# Patient Record
Sex: Female | Born: 1975 | Race: White | Hispanic: No | Marital: Married | Smoking: Former smoker
Health system: Southern US, Community
[De-identification: ages and names within clinical notes are randomized; demographics above are authoritative.]

## PROBLEM LIST (undated history)

## (undated) DIAGNOSIS — E7800 Pure hypercholesterolemia, unspecified: Secondary | ICD-10-CM

## (undated) DIAGNOSIS — F419 Anxiety disorder, unspecified: Secondary | ICD-10-CM

## (undated) DIAGNOSIS — N200 Calculus of kidney: Secondary | ICD-10-CM

## (undated) DIAGNOSIS — O24419 Gestational diabetes mellitus in pregnancy, unspecified control: Secondary | ICD-10-CM

## (undated) DIAGNOSIS — IMO0001 Reserved for inherently not codable concepts without codable children: Secondary | ICD-10-CM

## (undated) DIAGNOSIS — Z9851 Tubal ligation status: Secondary | ICD-10-CM

## (undated) HISTORY — DX: Anxiety disorder, unspecified: F41.9

## (undated) HISTORY — DX: Gestational diabetes mellitus in pregnancy, unspecified control: O24.419

## (undated) HISTORY — PX: NO PAST SURGERIES: SHX2092

---

## 2010-01-18 ENCOUNTER — Emergency Department (HOSPITAL_COMMUNITY): Admission: EM | Admit: 2010-01-18 | Discharge: 2010-01-18 | Payer: Self-pay | Admitting: Emergency Medicine

## 2010-07-13 ENCOUNTER — Emergency Department (HOSPITAL_COMMUNITY): Admission: EM | Admit: 2010-07-13 | Discharge: 2010-07-14 | Payer: Self-pay | Admitting: Emergency Medicine

## 2010-07-13 ENCOUNTER — Emergency Department (HOSPITAL_COMMUNITY): Admission: EM | Admit: 2010-07-13 | Discharge: 2010-07-13 | Payer: Self-pay | Admitting: Family Medicine

## 2011-02-11 LAB — POCT URINALYSIS DIPSTICK
Ketones, ur: NEGATIVE mg/dL
Nitrite: NEGATIVE
Specific Gravity, Urine: 1.025 (ref 1.005–1.030)
Urobilinogen, UA: 0.2 mg/dL (ref 0.0–1.0)

## 2011-02-11 LAB — DIFFERENTIAL
Basophils Absolute: 0.1 10*3/uL (ref 0.0–0.1)
Basophils Relative: 1 % (ref 0–1)
Eosinophils Absolute: 0.3 10*3/uL (ref 0.0–0.7)
Eosinophils Relative: 3 % (ref 0–5)
Lymphocytes Relative: 34 % (ref 12–46)
Lymphs Abs: 2.8 10*3/uL (ref 0.7–4.0)
Neutro Abs: 4.3 10*3/uL (ref 1.7–7.7)

## 2011-02-11 LAB — BASIC METABOLIC PANEL
CO2: 28 mEq/L (ref 19–32)
Calcium: 9.4 mg/dL (ref 8.4–10.5)
Chloride: 104 mEq/L (ref 96–112)
GFR calc non Af Amer: >60 mL/min (ref >60)

## 2011-02-11 LAB — HEPATIC FUNCTION PANEL
Bilirubin, Direct: <0.1 mg/dL (ref 0.0–0.3)
Total Protein: 7.6 g/dL (ref 6.0–8.3)

## 2011-02-11 LAB — WET PREP, GENITAL
Trich, Wet Prep: NONE SEEN
Yeast Wet Prep HPF POC: NONE SEEN

## 2011-02-11 LAB — URINALYSIS, ROUTINE W REFLEX MICROSCOPIC: Ketones, ur: NEGATIVE mg/dL

## 2011-02-11 LAB — GC/CHLAMYDIA PROBE AMP, GENITAL
Chlamydia, DNA Probe: NEGATIVE
GC Probe Amp, Genital: NEGATIVE

## 2011-02-11 LAB — PREGNANCY, URINE: Preg Test, Ur: NEGATIVE

## 2011-02-11 LAB — CBC
MCH: 29.6 pg (ref 26.0–34.0)
MCHC: 33.4 g/dL (ref 30.0–36.0)
MCV: 88.7 fL (ref 78.0–100.0)
Platelets: 241 10*3/uL (ref 150–400)
RBC: 4.52 MIL/uL (ref 3.87–5.11)
RDW: 12.5 % (ref 11.5–15.5)
WBC: 8.1 10*3/uL (ref 4.0–10.5)

## 2011-02-11 LAB — POCT PREGNANCY, URINE: Preg Test, Ur: NEGATIVE

## 2013-01-30 ENCOUNTER — Ambulatory Visit (INDEPENDENT_AMBULATORY_CARE_PROVIDER_SITE_OTHER): Payer: 59 | Admitting: Physician Assistant

## 2013-01-30 ENCOUNTER — Encounter: Payer: Self-pay | Admitting: Physician Assistant

## 2013-01-30 VITALS — BP 128/79 | HR 74 | Ht 66.25 in | Wt 158.0 lb

## 2013-01-30 DIAGNOSIS — Z13220 Encounter for screening for lipoid disorders: Secondary | ICD-10-CM

## 2013-01-30 DIAGNOSIS — I4902 Ventricular flutter: Secondary | ICD-10-CM

## 2013-01-30 DIAGNOSIS — I498 Other specified cardiac arrhythmias: Secondary | ICD-10-CM

## 2013-01-30 DIAGNOSIS — F411 Generalized anxiety disorder: Secondary | ICD-10-CM

## 2013-01-30 DIAGNOSIS — F410 Panic disorder [episodic paroxysmal anxiety] without agoraphobia: Secondary | ICD-10-CM | POA: Insufficient documentation

## 2013-01-30 DIAGNOSIS — Z131 Encounter for screening for diabetes mellitus: Secondary | ICD-10-CM

## 2013-01-30 MED ORDER — SERTRALINE HCL 100 MG PO TABS: ORAL_TABLET | ORAL | Status: DC

## 2013-01-30 MED ORDER — ALPRAZOLAM 0.5 MG PO TABS: 0.5000 mg | ORAL_TABLET | ORAL | Status: DC | PRN

## 2013-01-30 NOTE — Patient Instructions (Addendum)
   Premature Ventricular Contraction Premature ventricular contraction (PVC) is an irregularity of the heart rhythm involving extra or skipped heartbeats. In some cases, they may occur without obvious cause or heart disease. Other times, they can be caused by an electrolyte change in the blood. These need to be corrected. They can also be seen when there is not enough oxygen going to the heart. A common cause of this is plaque or cholesterol buildup. This buildup decreases the blood supply to the heart. In addition, extra beats may be caused or aggravated by:  Excessive smoking.  Alcohol consumption.  Caffeine.  Certain medications  Some street drugs. SYMPTOMS   The sensation of feeling your heart skipping a beat (palpitations).  In many cases, the person may have no symptoms. SIGNS AND TESTS   A physical examination may show an occasional irregularity, but if the PVC beats do not happen often, they may not be found on physical exam.  Blood pressure is usually normal.  Other tests that may find extra beats of the heart are:  An EKG (electrocardiogram)  A Holter monitor which can monitor your heart over longer periods of time  An Angiogram (study of the heart arteries). TREATMENT  Usually extra heartbeats do not need treatment. The condition is treated only if symptoms are severe or if extra beats are very frequent or are causing problems. An underlying cause, if discovered, may also require treatment.  Treatment may also be needed if there may be a risk for other more serious cardiac arrhythmias.  PREVENTION   Moderation in caffeine, alcohol, and tobacco use may reduce the risk of ectopic heartbeats in some people.  Exercise often helps people who lead a sedentary (inactive) lifestyle. PROGNOSIS  PVC heartbeats are generally harmless and do not need treatment.  RISKS AND COMPLICATIONS   Ventricular tachycardia (occasionally).  There usually are no complications.  Other  arrhythmias (occasionally). SEEK IMMEDIATE MEDICAL CARE IF:   You feel palpitations that are frequent or continual.  You develop chest pain or other problems such as shortness of breath, sweating, or nausea and vomiting.  You become light-headed or faint (pass out).  You get worse or do not improve with treatment. Document Released: 07/01/2004 Document Revised: 02/06/2012 Document Reviewed: 01/11/2008 [facility] Patient Information 2013 [place], [place].

## 2013-01-30 NOTE — Progress Notes (Signed)
  Subjective:    Patient ID: Makayla Lopez, female    DOB: 1976/04/17, 37 y.o.   MRN: [id number]  HPI Patient is a 37 yo female who presents to the clinic to establish care. PMH of anxiety and panic attacks. This has just started in November that she has needed medication. She was put on Zoloft and did help. In January zoloft was increased to 100mg  and also helped some. She had been having panic attacks every day but now only 2-3 times a week. She does have xanax and dose work but does not like taking it. She admits to having a lot going on. Her family moved here from [place] for her husbands job and then he got laid off. She feels like she is carrying the burden. Husband recently got a job and things feel like they are getting better. When she has the attacks her heart flutters and feels like it is going so fast. She denies any CP or SOB. She does have weird left arm pain off and on and remembers no injury. She tries to exercise in the form of running when she has a chance. She never experiences panic attack while running.  Denies any excessive caffiene. Does not drink alcohol.   Family, Social, surgical hx reviewed.   Pap up to date.  .    Review of Systems  Constitutional: Negative.   HENT: Negative.   Eyes: Negative.   Respiratory: Negative.   Cardiovascular: Negative.   Gastrointestinal: Negative.   Endocrine: Negative.   Genitourinary: Negative.   Musculoskeletal: Negative.   Skin: Negative.   Neurological: Negative.   Hematological: Negative.        Objective:   Physical Exam  Constitutional: She appears well-developed and well-nourished.  HENT:  Head: Normocephalic and atraumatic.  Eyes: Conjunctivae are normal. Right eye exhibits no discharge. Left eye exhibits no discharge.  Neck: Normal range of motion. Neck supple. No thyromegaly present.  Cardiovascular: Normal rate, regular rhythm and normal heart sounds.   Pulmonary/Chest: Effort normal and breath sounds normal. She has  no wheezes.  Lymphadenopathy:    She has no cervical adenopathy.  Skin: Skin is warm and dry.  Psychiatric: She has a normal mood and affect. Her behavior is normal.          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 EKG no acute ST changes. NSR. Reassured patient that sounded like anxiety and panic attacks. Encouraged her to take xanax at onset. Discussed adding buspar but patient did not want to start at this point. Will increase zoloft to 150mg  daily.Discussed there could be some PVC;s going on. Gave handout to look over and see if any changes in diet need to be made. If continues or worsens can consider stress testing. Will check TSH.  Follow up in 6 to 8 weeks.   Needs CPE at some point. Gave lab slip to get fasting labs drawn.

## 2013-01-31 LAB — COMPREHENSIVE METABOLIC PANEL
CO2: 26 mEq/L (ref 19–32)
Calcium: 9.8 mg/dL (ref 8.4–10.5)
Chloride: 104 mEq/L (ref 96–112)
Potassium: 4.2 mEq/L (ref 3.5–5.3)
Sodium: 139 mEq/L (ref 135–145)
Total Bilirubin: 0.5 mg/dL (ref 0.3–1.2)
Total Protein: 7.8 g/dL (ref 6.0–8.3)

## 2013-01-31 LAB — CBC WITH DIFFERENTIAL/PLATELET
Basophils Absolute: 0.1 10*3/uL (ref 0.0–0.1)
Basophils Relative: 1 % (ref 0–1)
MCHC: 33.7 g/dL (ref 30.0–36.0)
Monocytes Absolute: 1 10*3/uL (ref 0.1–1.0)
RDW: 13.3 % (ref 11.5–15.5)

## 2013-01-31 LAB — LIPID PANEL
LDL Cholesterol: 172 mg/dL — ABNORMAL HIGH (ref 0–99)
Triglycerides: 116 mg/dL (ref <150)
VLDL: 23 mg/dL (ref 0–40)

## 2013-01-31 LAB — TSH: TSH: 3.083 u[IU]/mL (ref 0.350–4.500)

## 2013-02-04 ENCOUNTER — Other Ambulatory Visit: Payer: Self-pay | Admitting: Physician Assistant

## 2013-02-04 MED ORDER — ERGOCALCIFEROL 1.25 MG (50000 UT) PO CAPS: 50000.0000 [IU] | ORAL_CAPSULE | ORAL | Status: DC

## 2013-02-06 ENCOUNTER — Encounter: Payer: Self-pay | Admitting: *Deleted

## 2013-02-06 ENCOUNTER — Other Ambulatory Visit: Payer: Self-pay | Admitting: Physician Assistant

## 2013-02-06 MED ORDER — PRAVASTATIN SODIUM 40 MG PO TABS: 40.0000 mg | ORAL_TABLET | Freq: Every day | ORAL | Status: DC

## 2013-02-06 NOTE — Progress Notes (Signed)
Sent to pharmacy. Take daily. Call with any muscle aches or cramps. Remember a low fat diet and exercise. Recheck in 3-4 months.

## 2013-02-07 NOTE — Progress Notes (Signed)
Pt.notified

## 2013-03-27 ENCOUNTER — Ambulatory Visit: Payer: 59 | Admitting: Physician Assistant

## 2013-03-27 DIAGNOSIS — Z0289 Encounter for other administrative examinations: Secondary | ICD-10-CM

## 2013-05-06 ENCOUNTER — Emergency Department (HOSPITAL_COMMUNITY)
Admission: EM | Admit: 2013-05-06 | Discharge: 2013-05-07 | Disposition: A | Discharge status: Home / Self Care | Payer: 59 | Attending: Emergency Medicine | Admitting: Emergency Medicine

## 2013-05-06 ENCOUNTER — Emergency Department
Admission: EM | Admit: 2013-05-06 | Discharge: 2013-05-06 | Disposition: A | Discharge status: Home / Self Care | Payer: 59 | Source: Home / Self Care | Attending: Family Medicine | Admitting: Family Medicine

## 2013-05-06 ENCOUNTER — Encounter (HOSPITAL_COMMUNITY): Payer: Self-pay | Admitting: Emergency Medicine

## 2013-05-06 ENCOUNTER — Emergency Department (INDEPENDENT_AMBULATORY_CARE_PROVIDER_SITE_OTHER): Payer: 59

## 2013-05-06 ENCOUNTER — Encounter: Payer: Self-pay | Admitting: *Deleted

## 2013-05-06 DIAGNOSIS — E7800 Pure hypercholesterolemia, unspecified: Secondary | ICD-10-CM | POA: Insufficient documentation

## 2013-05-06 DIAGNOSIS — Z975 Presence of (intrauterine) contraceptive device: Secondary | ICD-10-CM | POA: Insufficient documentation

## 2013-05-06 DIAGNOSIS — R300 Dysuria: Secondary | ICD-10-CM

## 2013-05-06 DIAGNOSIS — Z87891 Personal history of nicotine dependence: Secondary | ICD-10-CM | POA: Insufficient documentation

## 2013-05-06 DIAGNOSIS — R350 Frequency of micturition: Secondary | ICD-10-CM

## 2013-05-06 DIAGNOSIS — Z79899 Other long term (current) drug therapy: Secondary | ICD-10-CM | POA: Insufficient documentation

## 2013-05-06 DIAGNOSIS — Z3202 Encounter for pregnancy test, result negative: Secondary | ICD-10-CM | POA: Insufficient documentation

## 2013-05-06 DIAGNOSIS — R3915 Urgency of urination: Secondary | ICD-10-CM | POA: Insufficient documentation

## 2013-05-06 DIAGNOSIS — R109 Unspecified abdominal pain: Secondary | ICD-10-CM

## 2013-05-06 DIAGNOSIS — N72 Inflammatory disease of cervix uteri: Secondary | ICD-10-CM | POA: Insufficient documentation

## 2013-05-06 DIAGNOSIS — R110 Nausea: Secondary | ICD-10-CM | POA: Insufficient documentation

## 2013-05-06 DIAGNOSIS — Z87442 Personal history of urinary calculi: Secondary | ICD-10-CM | POA: Insufficient documentation

## 2013-05-06 DIAGNOSIS — F411 Generalized anxiety disorder: Secondary | ICD-10-CM | POA: Insufficient documentation

## 2013-05-06 HISTORY — DX: Pure hypercholesterolemia, unspecified: E78.00

## 2013-05-06 HISTORY — DX: Calculus of kidney: N20.0

## 2013-05-06 LAB — POCT URINALYSIS DIP (MANUAL ENTRY)
Glucose, UA: NEGATIVE
Ketones, POC UA: NEGATIVE
Leukocytes, UA: NEGATIVE
Nitrite, UA: NEGATIVE
Protein Ur, POC: NEGATIVE
Spec Grav, UA: 1.025 (ref 1.005–1.03)
Urobilinogen, UA: 0.2 (ref 0–1)
pH, UA: 6.5 (ref 5–8)

## 2013-05-06 MED ORDER — CEFTRIAXONE SODIUM 1 G IJ SOLR
1.0000 g | Freq: Once | INTRAMUSCULAR | Status: AC
  Administered 2013-05-06: 1 g via INTRAMUSCULAR

## 2013-05-06 MED ORDER — CEPHALEXIN 500 MG PO CAPS: 500.0000 mg | ORAL_CAPSULE | Freq: Three times a day (TID) | ORAL | Status: DC

## 2013-05-06 MED ORDER — KETOROLAC TROMETHAMINE 30 MG/ML IJ SOLN
30.0000 mg | Freq: Once | INTRAMUSCULAR | Status: AC
  Administered 2013-05-06: 30 mg via INTRAMUSCULAR

## 2013-05-06 NOTE — ED Notes (Signed)
PT. REPORTS LOW BACK PAIN / RIGHT ABDOMINAL PAIN ONSET TODAY , SEEN AT AN URGENT CARE TODAY DIAGNOSED WITH UTI RECEIVED ROCEPHIN AND TORADOL IM WITH TEMPORARY RELIEF , PT. STATED HISTORY OF KIDNEY STONES.

## 2013-05-06 NOTE — ED Provider Notes (Signed)
History     CSN: [id number]  Arrival date & time 05/06/13  1858   First MD Initiated Contact with Patient 05/06/13 1900      Chief Complaint  Patient presents with  . Urinary Frequency   HPI  DYSURIA Onset:  4-6 weeks  Description: intermittent dysuria, flank pain  Modifying factors: hx/o kidney stones in the past   Symptoms Urgency:  yes Frequency: yes  Hesitancy:  yes Hematuria:  no Flank Pain:  yes Fever: no Nausea/Vomiting:  yes Missed LMP: no STD exposure: no Discharge: no Irritants: no Rash: no  Red Flags   More than 3 UTI's last 12 months:  no PMH of  Diabetes or Immunosuppression:  no Renal Disease/Calculi: yes Urinary Tract Abnormality:  no Instrumentation or Trauma: no    Past Medical History  Diagnosis Date  . Anxiety     History reviewed. No pertinent past surgical history.  Family History  Problem Relation Age of Onset  . Diabetes Mother   . Hyperlipidemia Father   . Hypertension Father   . Cancer Paternal Grandfather     History  Substance Use Topics  . Smoking status: Former Games developer  . Smokeless tobacco: Not on file  . Alcohol Use: Not on file    OB History   Grav Para Term Preterm Abortions TAB SAB Ect Mult Living                  Review of Systems  All other systems reviewed and are negative.    Allergies  Review of patient's allergies indicates no known allergies.  Home Medications   Current Outpatient Rx  Name  Route  Sig  Dispense  Refill  . ALPRAZolam (XANAX) 0.5 MG tablet   Oral   Take 1 tablet (0.5 mg total) by mouth as needed for anxiety. Up to twice a day.   30 tablet   0   . Biotin 5000 MCG CAPS   Oral   Take by mouth daily.         . ergocalciferol (VITAMIN D2) 50000 UNITS capsule   Oral   Take 1 capsule (50,000 Units total) by mouth once a week.   4 capsule   1   . Multiple Vitamin (MULTIVITAMIN) tablet   Oral   Take 1 tablet by mouth daily.         . pravastatin (PRAVACHOL) 40 MG  tablet   Oral   Take 1 tablet (40 mg total) by mouth daily.   30 tablet   3   . sertraline (ZOLOFT) 100 MG tablet      Take 1 and 1/2 tab daily.   45 tablet   2     BP 130/79  Pulse 95  Temp(Src) 98.2 F (36.8 C) (Oral)  Resp 16  Ht 5\' 6"  (1.676 m)  Wt 161 lb 12 oz (73.369 kg)  BMI 26.12 kg/m2  SpO2 100%  Physical Exam  Constitutional: She appears well-developed and well-nourished.  HENT:  Head: Normocephalic and atraumatic.  Eyes: Conjunctivae are normal. Pupils are equal, round, and reactive to light.  Neck: Normal range of motion.  Cardiovascular: Normal rate and regular rhythm.   Pulmonary/Chest: Effort normal.  Abdominal: Soft. Bowel sounds are normal.  + R sided flank pain  + suprapubic tenderness   Neurological: She is alert.  Skin: Skin is warm.    ED Course  Procedures (including critical care time)  Labs Reviewed  URINE CULTURE  POCT URINALYSIS DIP (MANUAL ENTRY)  Dg Abd 1 View  05/06/2013   *RADIOLOGY REPORT*  Clinical Data: Urinary frequency.  ABDOMEN - 1 VIEW  Comparison: None.  Findings: No abnormal calcifications are identified.  The bowel gas pattern is unremarkable.  IUD device is present in the central pelvis.  The visualized bony structures are unremarkable.  IMPRESSION: Normal abdominal film.   Original Report Authenticated By: Irish Lack, M.D.     1. Urinary frequency   2. Dysuria   3. Flank pain       MDM  KUB negative for kidney stones.  Rocephin 1gm IM x1  Will place on course of keflex.  Urine culture  Discussed infectious and GU red flags.  Go to ER if sxs worsen  Follow up as needed.     The patient and/or caregiver has been counseled thoroughly with regard to treatment plan and/or medications prescribed including dosage, schedule, interactions, rationale for use, and possible side effects and they verbalize understanding. Diagnoses and expected course of recovery discussed and will return if not improved as expected  or if the condition worsens. Patient and/or caregiver verbalized understanding.             Doree Albee, MD 05/06/13 [phone]

## 2013-05-06 NOTE — ED Provider Notes (Signed)
History     CSN: [id number]  Arrival date & time 05/06/13  2300   First MD Initiated Contact with Patient 05/06/13 2340      Chief Complaint  Patient presents with  . Back Pain    (Consider location/radiation/quality/duration/timing/severity/associated sxs/prior treatment) HPI Pt is a 37yo female with hx of kidney stones c/o acute onset right sided flank pain that is sharp and shooting down to groin, started earlier this morning, pain waxes and wanes, 7/10.  Pt was seen at urgent care earlier today, dx with UTI, was given toradol and rocephin in office, discharged home with keflex, advised to return to ED if symptoms worsened.  Pt states pain initially improved, 2/10 but came back even worse.  Pt also reports mild nausea w/o vomiting.  Pt also c/o lower abdominal pain that is cramping and dull in nature, 7/10, constant.  Denies fever or chills.  Denies pain with urination but states she feels like she cannot empty her bladder, has increased urinary frequency and urgency.  Denies previous abdominal surgeries.  Has IUD in place, was changed 1 year ago.  Denies vaginal pain or discharge.   Past Medical History  Diagnosis Date  . Anxiety   . Kidney stone   . Hypercholesteremia     History reviewed. No pertinent past surgical history.  Family History  Problem Relation Age of Onset  . Diabetes Mother   . Hyperlipidemia Father   . Hypertension Father   . Cancer Paternal Grandfather     History  Substance Use Topics  . Smoking status: Former Games developer  . Smokeless tobacco: Not on file  . Alcohol Use: No    OB History   Grav Para Term Preterm Abortions TAB SAB Ect Mult Living                  Review of Systems  Constitutional: Negative for fever, chills, diaphoresis and fatigue.  Respiratory: Negative for shortness of breath.   Cardiovascular: Negative for chest pain.  Gastrointestinal: Positive for nausea and abdominal pain. Negative for vomiting, diarrhea and constipation.   Genitourinary: Positive for urgency, frequency and flank pain. Negative for dysuria, hematuria, vaginal bleeding, vaginal discharge, vaginal pain and pelvic pain.  All other systems reviewed and are negative.    Allergies  Review of patient's allergies indicates no known allergies.  Home Medications   Current Outpatient Rx  Name  Route  Sig  Dispense  Refill  . Biotin 5000 MCG CAPS   Oral   Take by mouth daily.         Marland Kitchen ibuprofen (ADVIL,MOTRIN) 200 MG tablet   Oral   Take 800 mg by mouth every 6 (six) hours as needed for pain.         . Multiple Vitamin (MULTIVITAMIN) tablet   Oral   Take 1 tablet by mouth daily.         Marland Kitchen oxyCODONE-acetaminophen (PERCOCET/ROXICET) 5-325 MG per tablet   Oral   Take 1 tablet by mouth once.         . pravastatin (PRAVACHOL) 40 MG tablet   Oral   Take 1 tablet (40 mg total) by mouth daily.   30 tablet   3   . cephALEXin (KEFLEX) 500 MG capsule   Oral   Take 1 capsule (500 mg total) by mouth 3 (three) times daily.   21 capsule   0   . doxycycline (VIBRAMYCIN) 100 MG capsule   Oral   Take 1 capsule (100  mg total) by mouth 2 (two) times daily.   28 capsule   0   . oxyCODONE-acetaminophen (PERCOCET) 5-325 MG per tablet   Oral   Take 1 tablet by mouth every 6 (six) hours as needed for pain.   13 tablet   0     BP 109/65  Pulse 75  Temp(Src) 98.3 F (36.8 C) (Oral)  Resp 14  SpO2 97%  Physical Exam  Nursing note and vitals reviewed. Constitutional: She appears well-developed and well-nourished. No distress.  Pt lying on exam bed, appears in mild discomfort, rubbing right side of abdomen.   HENT:  Head: Normocephalic and atraumatic.  Eyes: Conjunctivae are normal. No scleral icterus.  Neck: Normal range of motion. Neck supple.  Cardiovascular: Normal rate, regular rhythm and normal heart sounds.   Pulmonary/Chest: Effort normal and breath sounds normal. No respiratory distress. She has no wheezes. She has no  rales. She exhibits no tenderness.  Abdominal: Soft. Bowel sounds are normal. She exhibits no distension and no mass. There is tenderness ( suprapubic region ). There is no rebound and no guarding. Hernia confirmed negative in the right inguinal area and confirmed negative in the left inguinal area.  Genitourinary: Uterus normal. No labial fusion. There is no rash, tenderness, lesion or injury on the right labia. There is no rash, tenderness, lesion or injury on the left labia. Cervix exhibits discharge ( scant yellow discharge). Cervix exhibits no motion tenderness and no friability. Right adnexum displays tenderness. Right adnexum displays no mass and no fullness. Left adnexum displays no mass, no tenderness and no fullness. No erythema, tenderness or bleeding around the vagina. There is a foreign body (IUD) around the vagina. No signs of injury around the vagina. No vaginal discharge found.  Musculoskeletal: Normal range of motion.  Lymphadenopathy:       Right: No inguinal adenopathy present.       Left: No inguinal adenopathy present.  Neurological: She is alert.  Skin: Skin is warm and dry. She is not diaphoretic.    ED Course  Procedures (including critical care time)  Labs Reviewed  WET PREP, GENITAL - Abnormal; Notable for the following:    Clue Cells Wet Prep HPF POC FEW (*)    WBC, Wet Prep HPF POC MANY (*)    All other components within normal limits  URINALYSIS, ROUTINE W REFLEX MICROSCOPIC - Abnormal; Notable for the following:    Hgb urine dipstick TRACE (*)    All other components within normal limits  CBC WITH DIFFERENTIAL - Abnormal; Notable for the following:    Monocytes Relative 14 (*)    Monocytes Absolute 1.2 (*)    All other components within normal limits  COMPREHENSIVE METABOLIC PANEL - Abnormal; Notable for the following:    ALT 38 (*)    Total Bilirubin 0.2 (*)    All other components within normal limits  GC/CHLAMYDIA PROBE AMP  URINE MICROSCOPIC-ADD ON   POCT PREGNANCY, URINE   Ct Abdomen Pelvis Wo Contrast  05/07/2013   *RADIOLOGY REPORT*  Clinical Data: Low back pain and right-sided abdominal pain today.  CT ABDOMEN AND PELVIS WITHOUT CONTRAST  Technique:  Multidetector CT imaging of the abdomen and pelvis was performed following the standard protocol without intravenous contrast.  Comparison: 07/14/2010  Findings: The lung bases are clear.  The kidneys appear symmetrical in size and shape.  No pyelocaliectasis or ureterectasis.  No renal, ureteral, or bladder stones are identified.  The unenhanced appearance of the liver,  spleen, gallbladder, pancreas, adrenal glands, abdominal aorta, inferior vena cava, and retroperitoneal lymph nodes is unremarkable.  Scattered mesenteric and right lower quadrant lymph nodes are not pathologically enlarged.  Stool filled colon without abnormal distension.  Stomach and small bowel are decompressed.  No free air or free fluid in the abdomen.  Abdominal wall musculature appears intact.  Pelvis:  Uterus is retroverted and intrauterine device is present. The bladder is mostly decompressed.  No free or loculated pelvic fluid collections.  No significant pelvic lymphadenopathy.  The appendix is normal.  No evidence of diverticulitis.  Normal alignment of the lumbar vertebrae.  IMPRESSION: No renal or ureteral stone or obstruction identified.   Original Report Authenticated By: Burman Nieves, M.D.   Dg Abd 1 View  05/06/2013   *RADIOLOGY REPORT*  Clinical Data: Urinary frequency.  ABDOMEN - 1 VIEW  Comparison: None.  Findings: No abnormal calcifications are identified.  The bowel gas pattern is unremarkable.  IUD device is present in the central pelvis.  The visualized bony structures are unremarkable.  IMPRESSION: Normal abdominal film.   Original Report Authenticated By: Irish Lack, M.D.   US Transvaginal Non-ob  05/07/2013   *RADIOLOGY REPORT*  Clinical Data: Lower pelvic pain.  Low back pain.  IUD in place for 2  years.  TRANSABDOMINAL AND TRANSVAGINAL ULTRASOUND OF PELVIS  DOPPLER ULTRASOUND OF OVARIES  Technique:  Both transabdominal and transvaginal ultrasound examinations of the pelvis were performed.  Transabdominal technique was performed for global imaging of the pelvis including uterus, ovaries, adnexal regions, and pelvic cul-de-sac. Color and duplex Doppler ultrasound was utilized to evaluate blood flow to the ovaries.  It was necessary to proceed with endovaginal exam following the transabdominal exam to visualize the the uterus and ovaries.  Comparison:  CT abdomen and pelvis 05/07/2013.  Findings: Uterus:  The uterus is retroverted and measures 10 x 6.5 x 7.8 cm. No myometrial mass lesions.  Endometrium: Foreign body demonstrated in the endometrium consistent with intrauterine device.  Location appears appropriate. No endometrial stripe thickness is identified.  Right ovary: The right ovary measures 2.5 x 1.8 x 1.9 cm.  Normal follicular changes are demonstrated.  Flow is demonstrated in the right ovary on color flow Doppler imaging.  Left ovary: The left ovary measures 4.8 x 3 x 2.2 cm.  Small complex cyst with internal debris measuring 1.6 x 1.8 x 1.9 cm likely representing a hemorrhagic cyst.  Flow is demonstrated in the left ovary on color flow Doppler imaging.  Pulsed Doppler evaluation demonstrates normal arterial and venous flow velocity waveforms in both ovaries.  Other Findings:  Small amount of free fluid demonstrated in the pelvis and around the left adnexal region.  This is likely to be physiologic.  IMPRESSION: Normal study.  No evidence of pelvic mass or other significant abnormality.  No sonographic evidence for ovarian torsion.   Original Report Authenticated By: Burman Nieves, M.D.   US Pelvis Complete  05/07/2013   *RADIOLOGY REPORT*  Clinical Data: Lower pelvic pain.  Low back pain.  IUD in place for 2 years.  TRANSABDOMINAL AND TRANSVAGINAL ULTRASOUND OF PELVIS  DOPPLER ULTRASOUND OF  OVARIES  Technique:  Both transabdominal and transvaginal ultrasound examinations of the pelvis were performed.  Transabdominal technique was performed for global imaging of the pelvis including uterus, ovaries, adnexal regions, and pelvic cul-de-sac. Color and duplex Doppler ultrasound was utilized to evaluate blood flow to the ovaries.  It was necessary to proceed with endovaginal exam following the transabdominal exam  to visualize the the uterus and ovaries.  Comparison:  CT abdomen and pelvis 05/07/2013.  Findings: Uterus:  The uterus is retroverted and measures 10 x 6.5 x 7.8 cm. No myometrial mass lesions.  Endometrium: Foreign body demonstrated in the endometrium consistent with intrauterine device.  Location appears appropriate. No endometrial stripe thickness is identified.  Right ovary: The right ovary measures 2.5 x 1.8 x 1.9 cm.  Normal follicular changes are demonstrated.  Flow is demonstrated in the right ovary on color flow Doppler imaging.  Left ovary: The left ovary measures 4.8 x 3 x 2.2 cm.  Small complex cyst with internal debris measuring 1.6 x 1.8 x 1.9 cm likely representing a hemorrhagic cyst.  Flow is demonstrated in the left ovary on color flow Doppler imaging.  Pulsed Doppler evaluation demonstrates normal arterial and venous flow velocity waveforms in both ovaries.  Other Findings:  Small amount of free fluid demonstrated in the pelvis and around the left adnexal region.  This is likely to be physiologic.  IMPRESSION: Normal study.  No evidence of pelvic mass or other significant abnormality.  No sonographic evidence for ovarian torsion.   Original Report Authenticated By: Burman Nieves, M.D.   [place] Art/ven Flow Abd Pelv Doppler  05/07/2013   *RADIOLOGY REPORT*  Clinical Data: Lower pelvic pain.  Low back pain.  IUD in place for 2 years.  TRANSABDOMINAL AND TRANSVAGINAL ULTRASOUND OF PELVIS  DOPPLER ULTRASOUND OF OVARIES  Technique:  Both transabdominal and transvaginal ultrasound  examinations of the pelvis were performed.  Transabdominal technique was performed for global imaging of the pelvis including uterus, ovaries, adnexal regions, and pelvic cul-de-sac. Color and duplex Doppler ultrasound was utilized to evaluate blood flow to the ovaries.  It was necessary to proceed with endovaginal exam following the transabdominal exam to visualize the the uterus and ovaries.  Comparison:  CT abdomen and pelvis 05/07/2013.  Findings: Uterus:  The uterus is retroverted and measures 10 x 6.5 x 7.8 cm. No myometrial mass lesions.  Endometrium: Foreign body demonstrated in the endometrium consistent with intrauterine device.  Location appears appropriate. No endometrial stripe thickness is identified.  Right ovary: The right ovary measures 2.5 x 1.8 x 1.9 cm.  Normal follicular changes are demonstrated.  Flow is demonstrated in the right ovary on color flow Doppler imaging.  Left ovary: The left ovary measures 4.8 x 3 x 2.2 cm.  Small complex cyst with internal debris measuring 1.6 x 1.8 x 1.9 cm likely representing a hemorrhagic cyst.  Flow is demonstrated in the left ovary on color flow Doppler imaging.  Pulsed Doppler evaluation demonstrates normal arterial and venous flow velocity waveforms in both ovaries.  Other Findings:  Small amount of free fluid demonstrated in the pelvis and around the left adnexal region.  This is likely to be physiologic.  IMPRESSION: Normal study.  No evidence of pelvic mass or other significant abnormality.  No sonographic evidence for ovarian torsion.   Original Report Authenticated By: Burman Nieves, M.D.     1. Cervicitis and endocervicitis       MDM  Pt dx with UTI earlier today at urgent care presented with increased right flank pain associated with mild nausea.  States she was given toradol and rocephin at urgent care, discharged home and advised to come to ED for worsening symptoms.  Concern for nephrolithiasis, or pelvic cause for pain such as mass or  infection.  Pt does have IUD in place which could cause PID.  Getting repeat  CBC, CMP, UA, urine preg, and a CT abd to look for stones.  11:50 AM Tx with toradol and zofran at this time.   Wet prep: many bacteria, evidence of pelvic infection will start tx in ED of azithromycin and rocephin.  Pt will need to be d/c home with doxycycline and f/u in the morning with GYN to have IUD removed.    Pelvic U/S pending at shift changed.  Signed out to Dr. Gayla Medicus, PA-C 05/07/13 1150  Junius Finner, PA-C 05/07/13 1152

## 2013-05-06 NOTE — ED Notes (Signed)
Patient c/o urinary frequency, back pain x 2 days.

## 2013-05-07 ENCOUNTER — Emergency Department (HOSPITAL_COMMUNITY): Payer: 59

## 2013-05-07 ENCOUNTER — Encounter (HOSPITAL_COMMUNITY): Payer: Self-pay | Admitting: Radiology

## 2013-05-07 LAB — COMPREHENSIVE METABOLIC PANEL
ALT: 38 U/L — ABNORMAL HIGH (ref 0–35)
Albumin: 4 g/dL (ref 3.5–5.2)
Alkaline Phosphatase: 62 U/L (ref 39–117)
Chloride: 104 mEq/L (ref 96–112)
Glucose, Bld: 92 mg/dL (ref 70–99)
Potassium: 3.8 mEq/L (ref 3.5–5.1)
Sodium: 137 mEq/L (ref 135–145)
Total Bilirubin: 0.2 mg/dL — ABNORMAL LOW (ref 0.3–1.2)
Total Protein: 7.5 g/dL (ref 6.0–8.3)

## 2013-05-07 LAB — URINALYSIS, ROUTINE W REFLEX MICROSCOPIC
Bilirubin Urine: NEGATIVE
Leukocytes, UA: NEGATIVE
Nitrite: NEGATIVE
Specific Gravity, Urine: 1.028 (ref 1.005–1.030)
Urobilinogen, UA: 0.2 mg/dL (ref 0.0–1.0)
pH: 5.5 (ref 5.0–8.0)

## 2013-05-07 LAB — CBC WITH DIFFERENTIAL/PLATELET
Basophils Relative: 1 % (ref 0–1)
Eosinophils Absolute: 0.4 10*3/uL (ref 0.0–0.7)
Hemoglobin: 12.9 g/dL (ref 12.0–15.0)
Lymphs Abs: 2.7 10*3/uL (ref 0.7–4.0)
MCH: 29 pg (ref 26.0–34.0)
Neutro Abs: 4.3 10*3/uL (ref 1.7–7.7)
Neutrophils Relative %: 50 % (ref 43–77)
Platelets: 266 10*3/uL (ref 150–400)
RBC: 4.45 MIL/uL (ref 3.87–5.11)

## 2013-05-07 LAB — WET PREP, GENITAL
Trich, Wet Prep: NONE SEEN
Yeast Wet Prep HPF POC: NONE SEEN

## 2013-05-07 LAB — URINE MICROSCOPIC-ADD ON

## 2013-05-07 MED ORDER — MORPHINE SULFATE 4 MG/ML IJ SOLN
4.0000 mg | Freq: Once | INTRAMUSCULAR | Status: AC
  Administered 2013-05-07: 4 mg via INTRAVENOUS
  Filled 2013-05-07: qty 1

## 2013-05-07 MED ORDER — CEFTRIAXONE SODIUM 250 MG IJ SOLR
250.0000 mg | Freq: Once | INTRAMUSCULAR | Status: AC
  Administered 2013-05-07: 250 mg via INTRAMUSCULAR
  Filled 2013-05-07: qty 250

## 2013-05-07 MED ORDER — KETOROLAC TROMETHAMINE 30 MG/ML IJ SOLN
30.0000 mg | Freq: Once | INTRAMUSCULAR | Status: AC
  Administered 2013-05-07: 30 mg via INTRAVENOUS
  Filled 2013-05-07: qty 1

## 2013-05-07 MED ORDER — LIDOCAINE HCL (PF) 1 % IJ SOLN
INTRAMUSCULAR | Status: AC
  Administered 2013-05-07: 5 mL
  Filled 2013-05-07: qty 5

## 2013-05-07 MED ORDER — KETOROLAC TROMETHAMINE 60 MG/2ML IM SOLN: 60.0000 mg | Freq: Once | INTRAMUSCULAR | Status: DC

## 2013-05-07 MED ORDER — OXYCODONE-ACETAMINOPHEN 5-325 MG PO TABS: 1.0000 | ORAL_TABLET | Freq: Four times a day (QID) | ORAL | Status: DC | PRN

## 2013-05-07 MED ORDER — ONDANSETRON 4 MG PO TBDP
4.0000 mg | ORAL_TABLET | Freq: Once | ORAL | Status: AC
  Administered 2013-05-07: 4 mg via ORAL
  Filled 2013-05-07: qty 1

## 2013-05-07 MED ORDER — DOXYCYCLINE HYCLATE 100 MG PO CAPS: 100.0000 mg | ORAL_CAPSULE | Freq: Two times a day (BID) | ORAL | Status: DC

## 2013-05-07 MED ORDER — AZITHROMYCIN 250 MG PO TABS
1000.0000 mg | ORAL_TABLET | Freq: Once | ORAL | Status: AC
  Administered 2013-05-07: 1000 mg via ORAL
  Filled 2013-05-07: qty 4

## 2013-05-07 NOTE — ED Provider Notes (Signed)
Medical screening examination/treatment/procedure(s) were performed by non-physician practitioner and as supervising physician I was immediately available for consultation/collaboration.  Jasmine Awe, MD 05/07/13 [phone]

## 2013-05-08 LAB — GC/CHLAMYDIA PROBE AMP
CT Probe RNA: NEGATIVE
GC Probe RNA: NEGATIVE

## 2013-05-09 LAB — URINE CULTURE

## 2013-10-03 ENCOUNTER — Other Ambulatory Visit: Payer: Self-pay

## 2013-11-09 IMAGING — CR DG ABDOMEN 1V
2 series · 2 of 2 positions shown · non-contrast
Comparison: None.

CLINICAL DATA: Urinary frequency.

ABDOMEN - 1 VIEW

[view not recorded (1 of 2)]
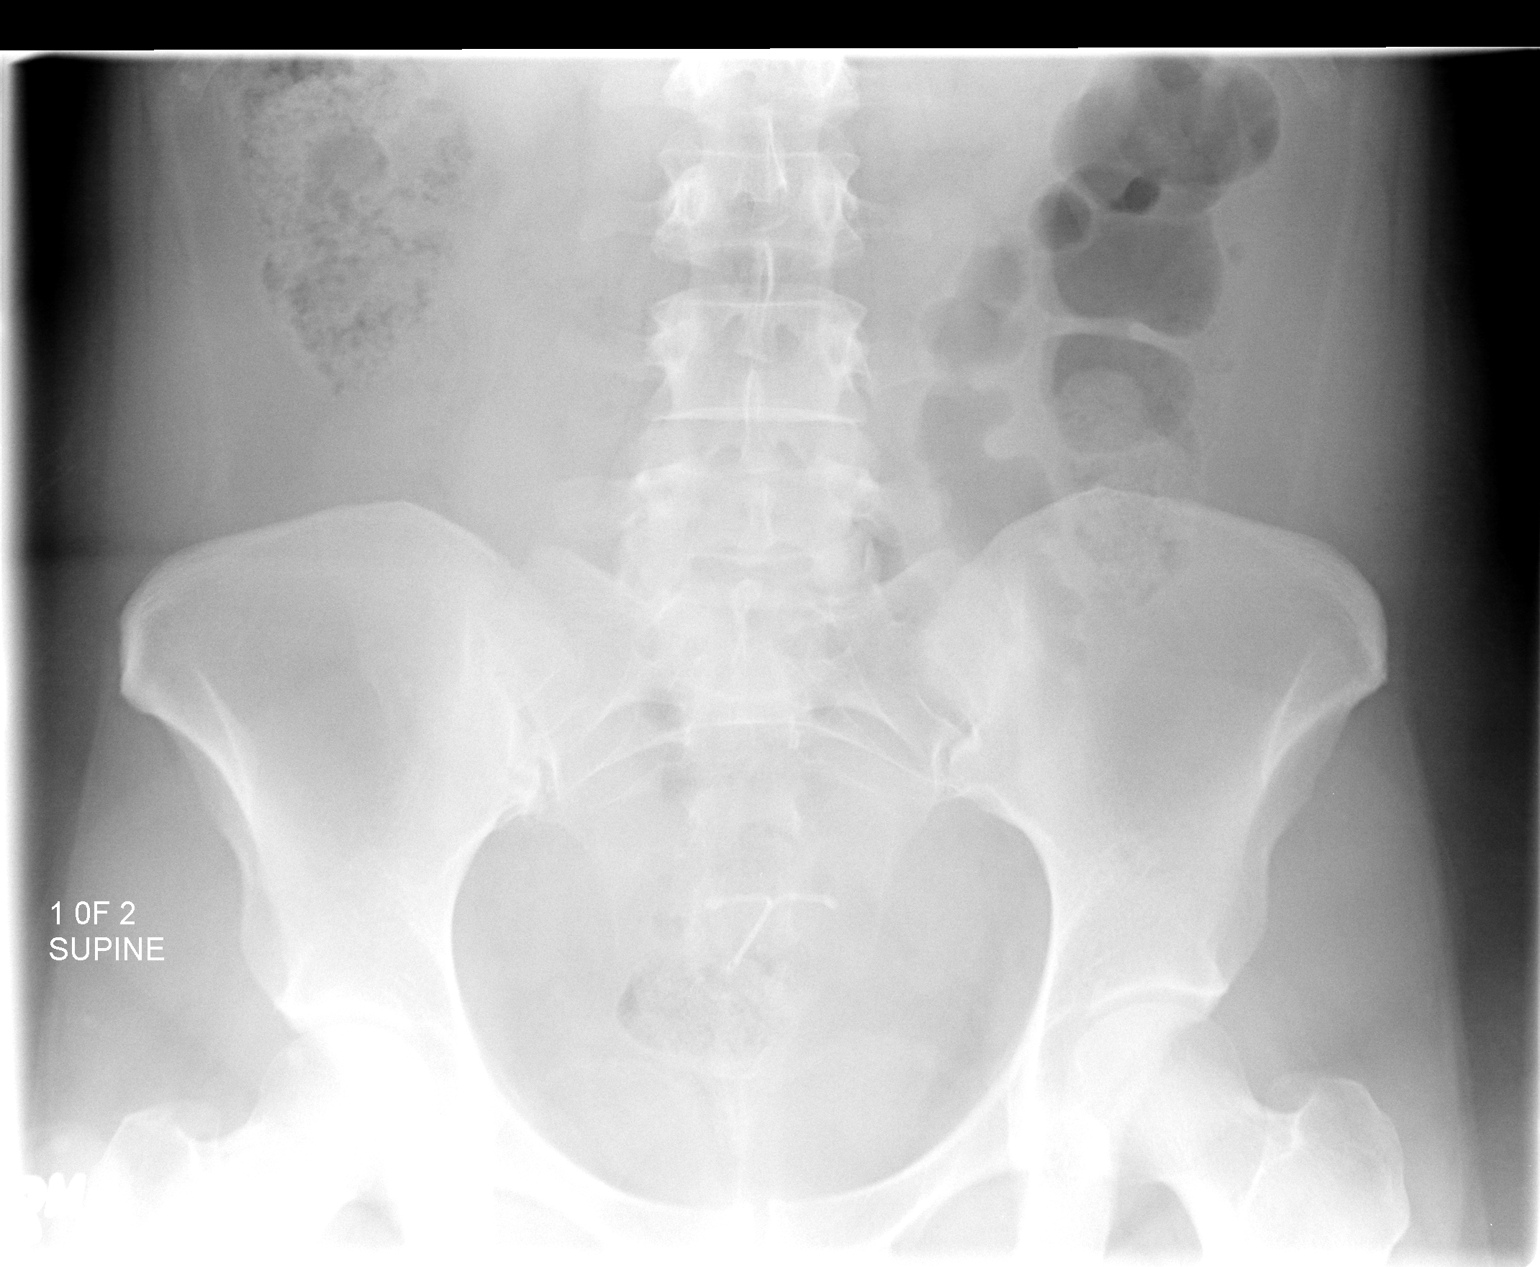

[view not recorded (2 of 2)]
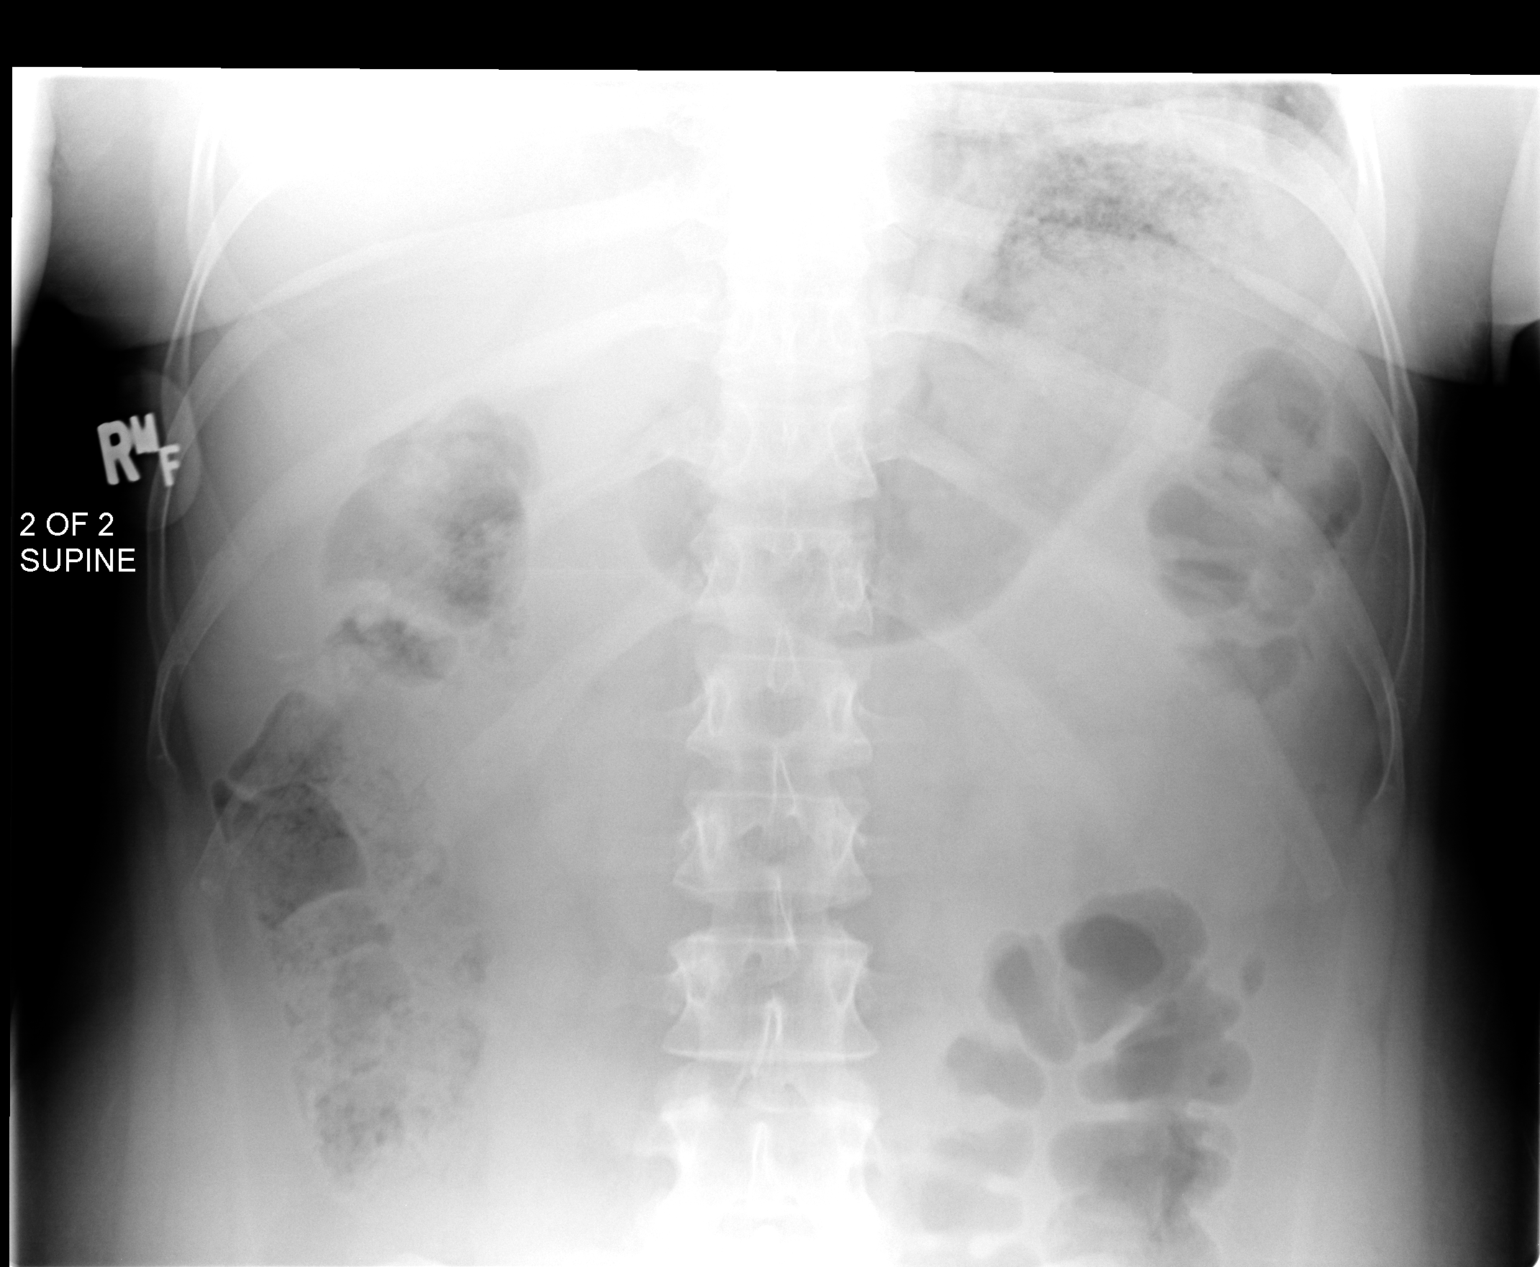

[2 of 2 positions shown; findings below may reference images not displayed]

FINDINGS: No abnormal calcifications are identified.  The bowel gas
pattern is unremarkable.  IUD device is present in the central
pelvis.  The visualized bony structures are unremarkable.
IMPRESSION: Normal abdominal film.

## 2013-11-10 IMAGING — CT CT ABD-PELV W/O CM
2 of 4 series · 13 of 32 positions shown, 19 images · non-contrast
Comparison: 07/14/2010

CLINICAL DATA: Low back pain and right-sided abdominal pain today.

CT ABDOMEN AND PELVIS WITHOUT CONTRAST
TECHNIQUE: Multidetector CT imaging of the abdomen and pelvis was
performed following the standard protocol without intravenous
contrast.

[Series 2: renal stone · axial · 0.70mm/px · z∈[-395,-45]mm · 9 of 88 slices shown, 15 images]
[im 9/88  soft-tissue]
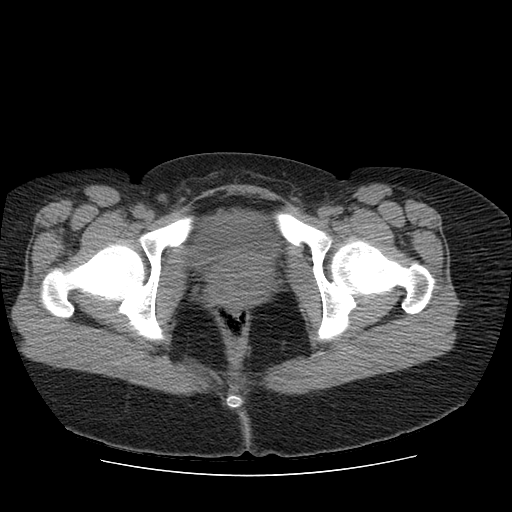
[im 9/88  bone]
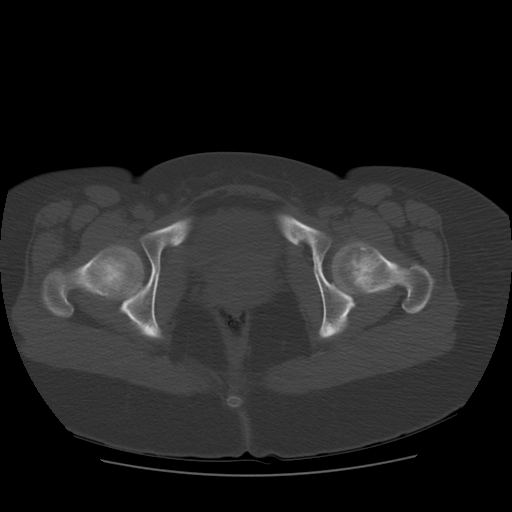
[im 18/88  soft-tissue]
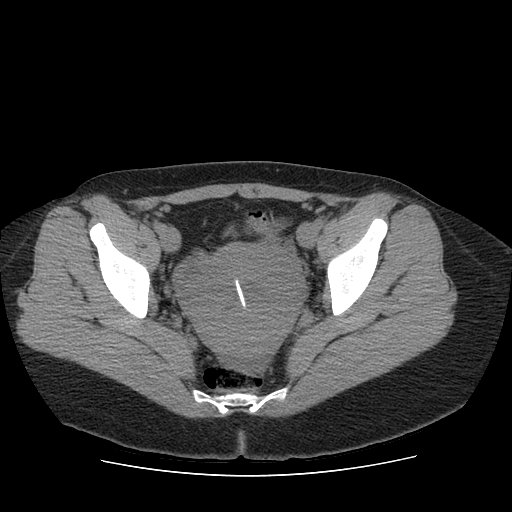
[im 27/88  soft-tissue]
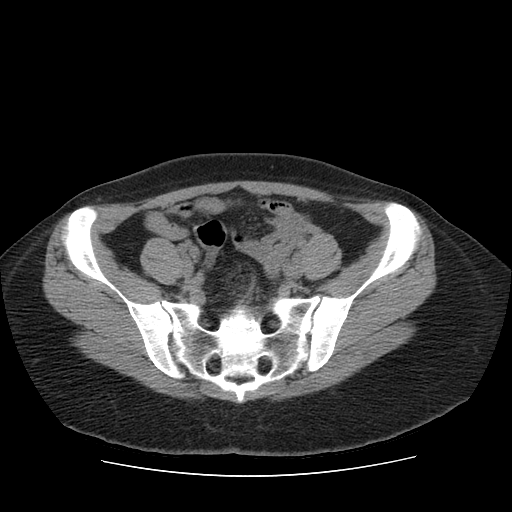
[im 35/88  soft-tissue]
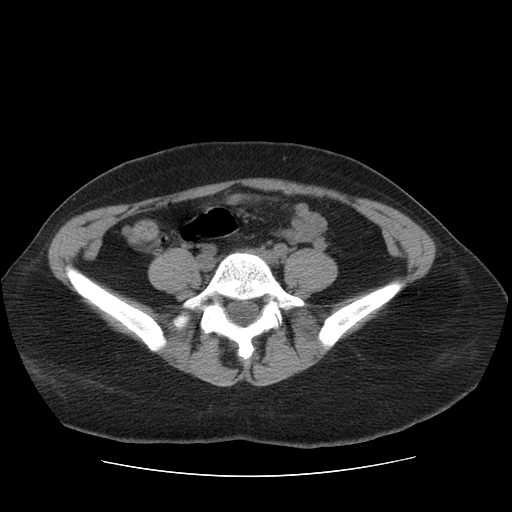
[im 44/88  soft-tissue]
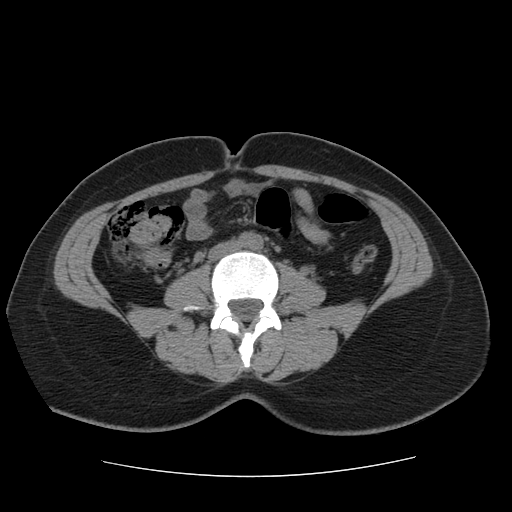
[im 53/88  soft-tissue]
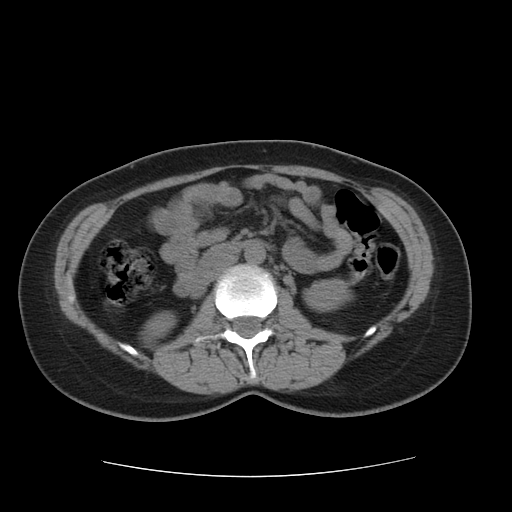
[im 53/88  lung]
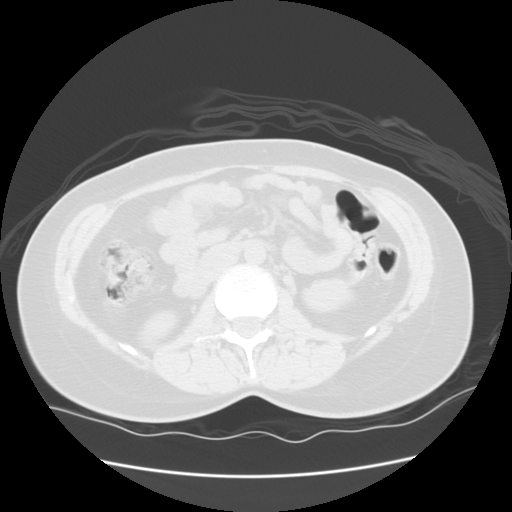
[im 61/88  soft-tissue]
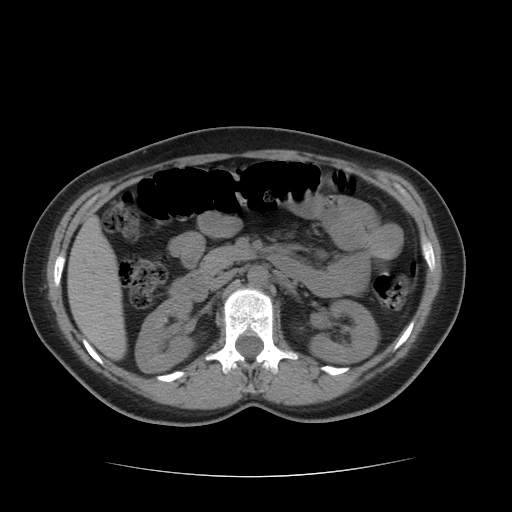
[im 61/88  lung]
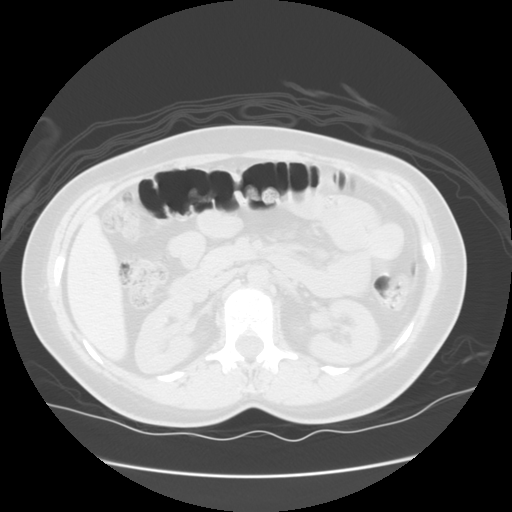
[im 70/88  soft-tissue]
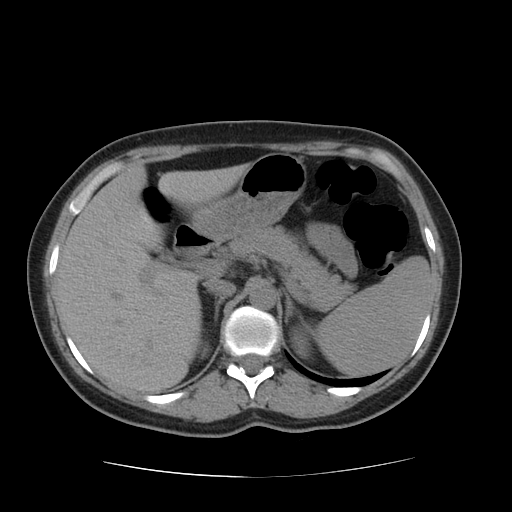
[im 70/88  lung]
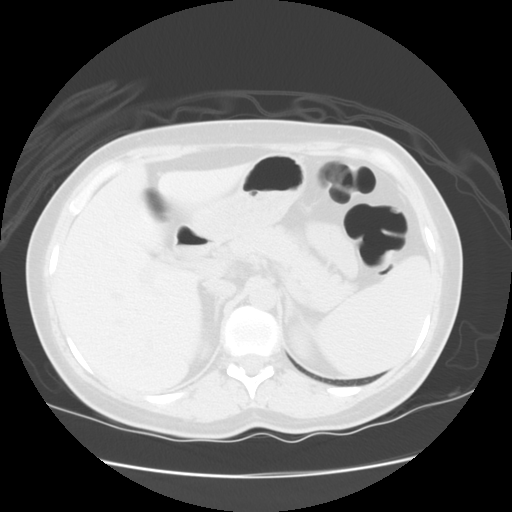
[im 79/88  soft-tissue]
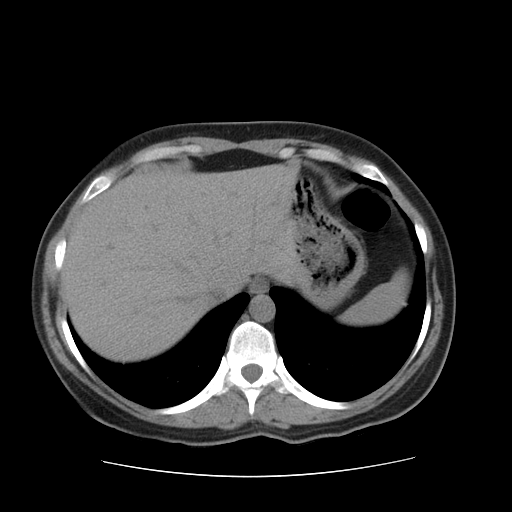
[im 79/88  lung]
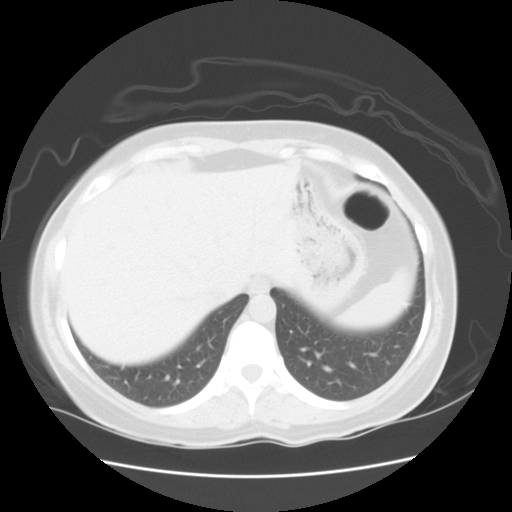
[im 79/88  bone]
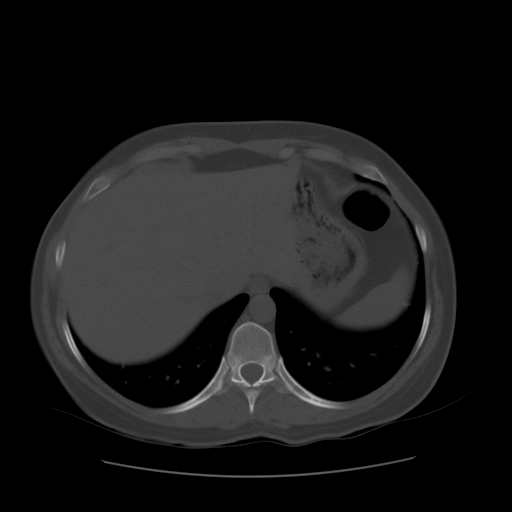

[Series 401: sagittal · sagittal · 0.92mm/px · 4 of 100 slices shown]
[im 10/100  soft-tissue]
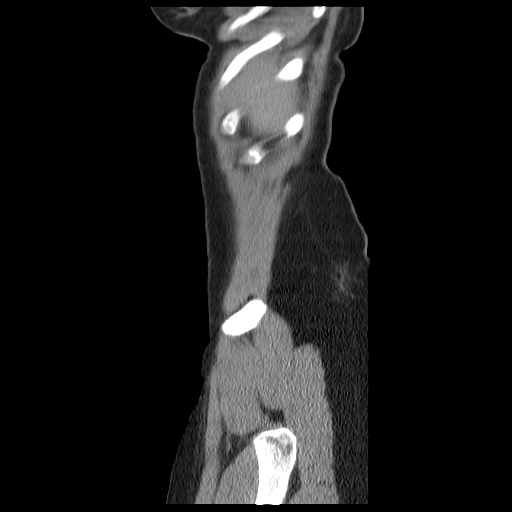
[im 19/100  soft-tissue]
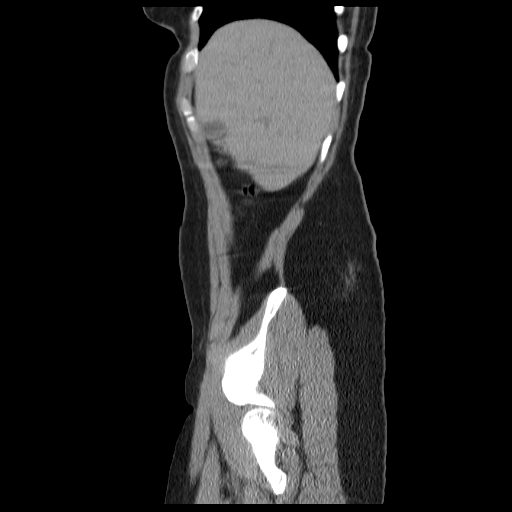
[im 37/100  soft-tissue]
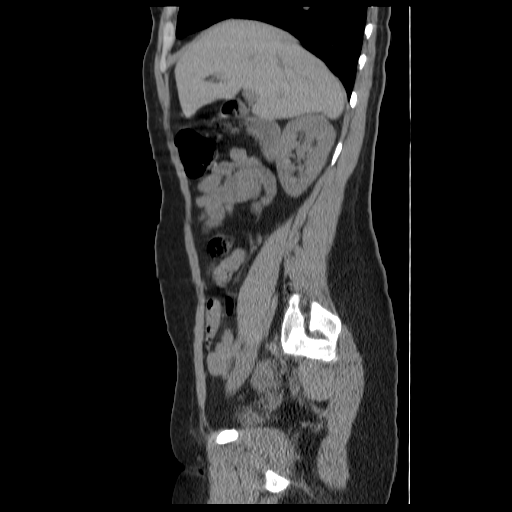
[im 46/100  soft-tissue]
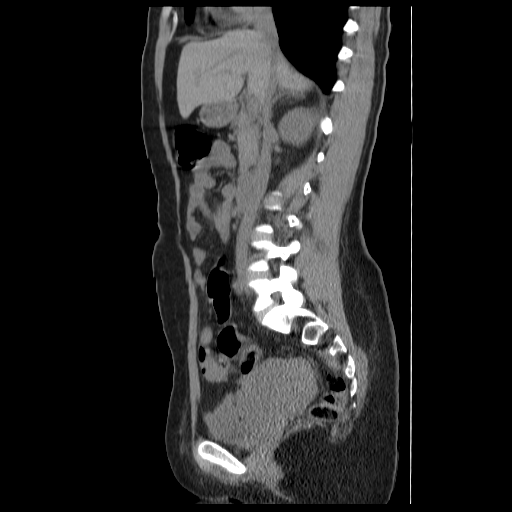

[13 of 32 positions shown; findings below may reference images not displayed]

FINDINGS: The lung bases are clear.

The kidneys appear symmetrical in size and shape.  No
pyelocaliectasis or ureterectasis.  No renal, ureteral, or bladder
stones are identified.

The unenhanced appearance of the liver, spleen, gallbladder,
pancreas, adrenal glands, abdominal aorta, inferior vena cava, and
retroperitoneal lymph nodes is unremarkable.  Scattered mesenteric
and right lower quadrant lymph nodes are not pathologically
enlarged.  Stool filled colon without abnormal distension.  Stomach
and small bowel are decompressed.  No free air or free fluid in the
abdomen.  Abdominal wall musculature appears intact.

Pelvis:  Uterus is retroverted and intrauterine device is present.
The bladder is mostly decompressed.  No free or loculated pelvic
fluid collections.  No significant pelvic lymphadenopathy.  The
appendix is normal.  No evidence of diverticulitis.  Normal
alignment of the lumbar vertebrae.
IMPRESSION: No renal or ureteral stone or obstruction identified.

## 2013-11-10 IMAGING — US US TRANSVAGINAL NON-OB
1 series · 13 of 25 positions shown · non-contrast
Comparison: CT abdomen and pelvis 05/07/2013.

CLINICAL DATA: Lower pelvic pain.  Low back pain.  IUD in place for
2 years.

TRANSABDOMINAL AND TRANSVAGINAL ULTRASOUND OF PELVIS
DOPPLER ULTRASOUND OF OVARIES
TECHNIQUE: Both transabdominal and transvaginal ultrasound
examinations of the pelvis were performed.  Transabdominal
technique was performed for global imaging of the pelvis including
uterus, ovaries, adnexal regions, and pelvic cul-de-sac. Color and
duplex Doppler ultrasound was utilized to evaluate blood flow to
the ovaries.
It was necessary to proceed with endovaginal exam following the
transabdominal exam to visualize the the uterus and ovaries..

[Series 1: us transvaginal non-ob · 0.28mm/px · 13 of 63 slices shown]
[im 1/63]
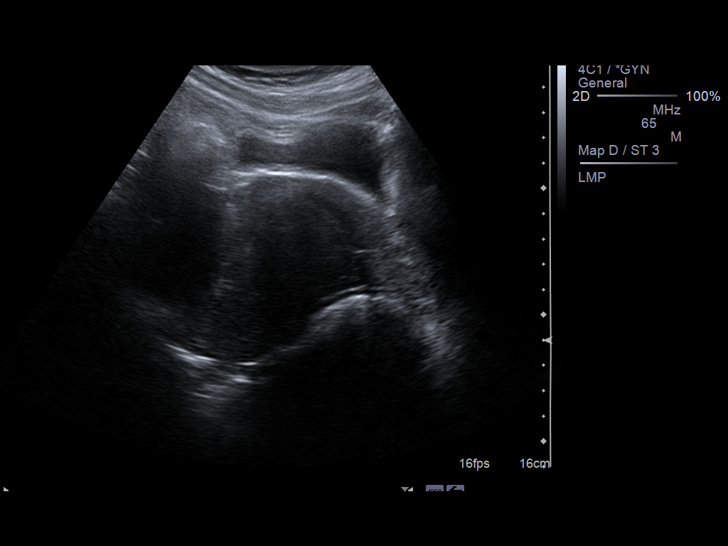
[im 6/63]
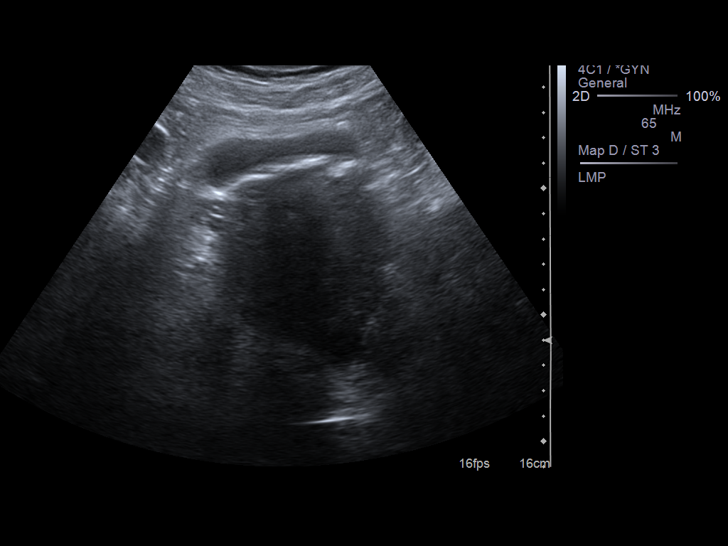
[im 11/63]
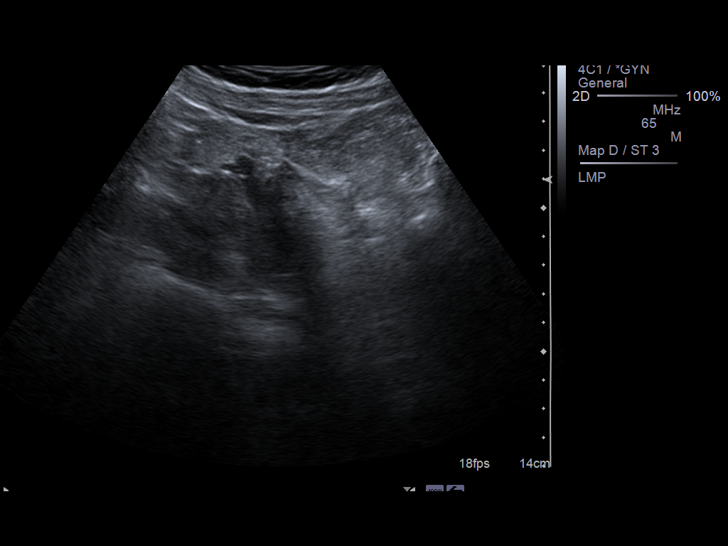
[im 16/63]
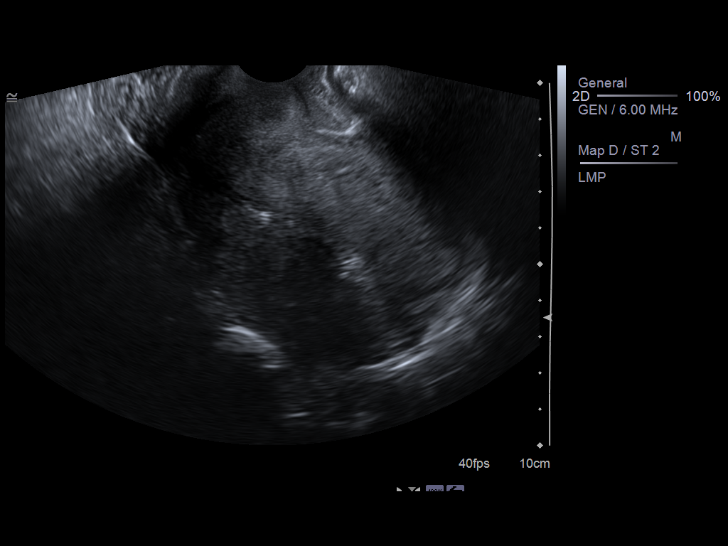
[im 21/63]
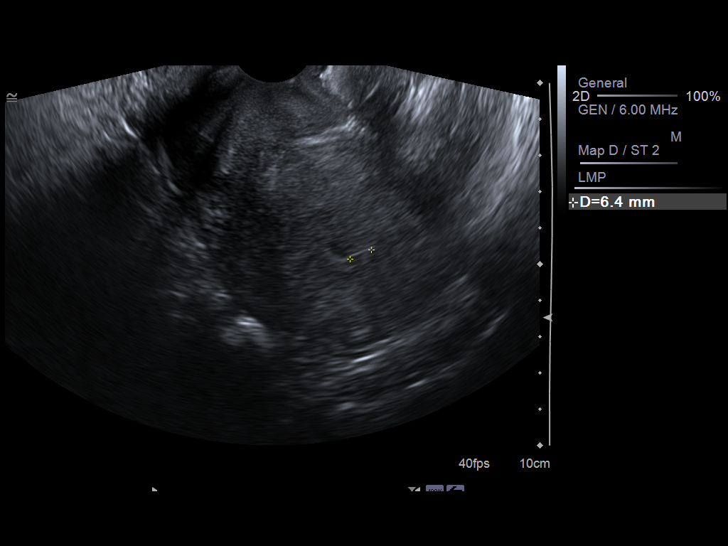
[im 26/63]
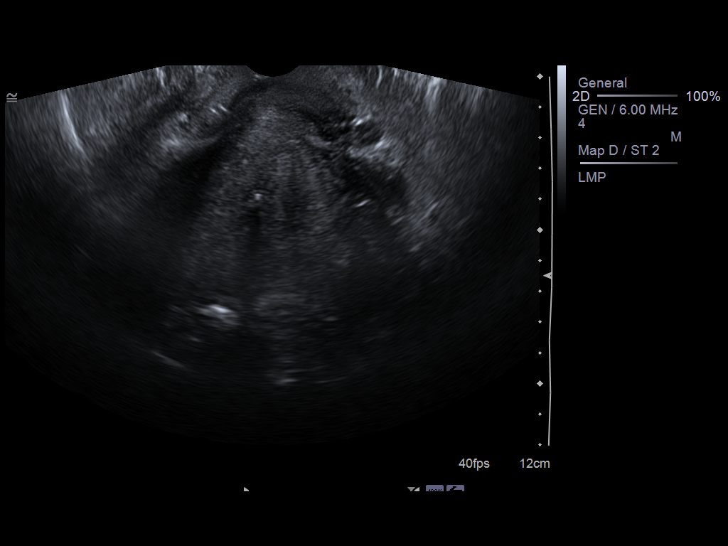
[im 32/63]
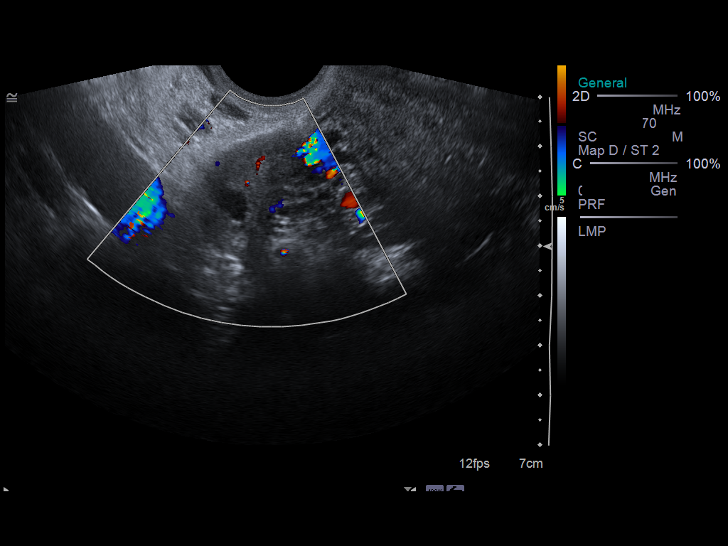
[im 37/63]
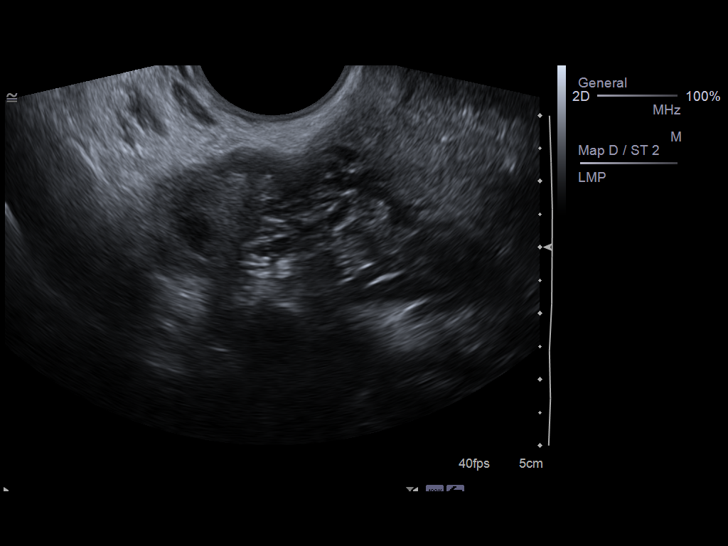
[im 42/63]
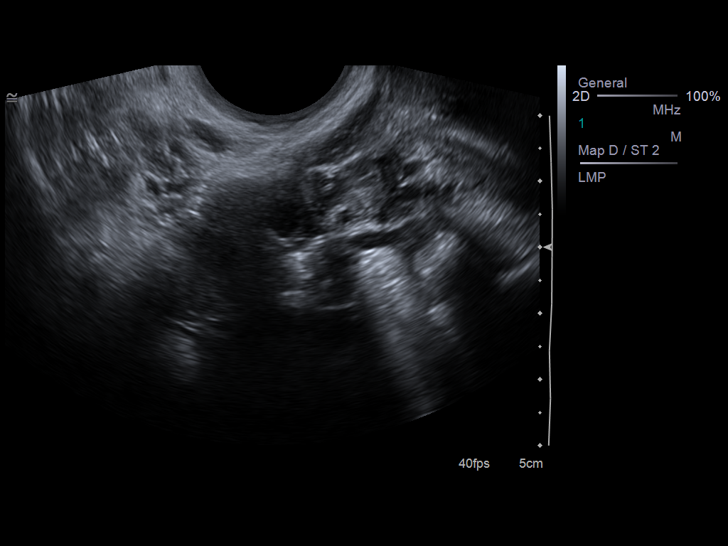
[im 47/63]
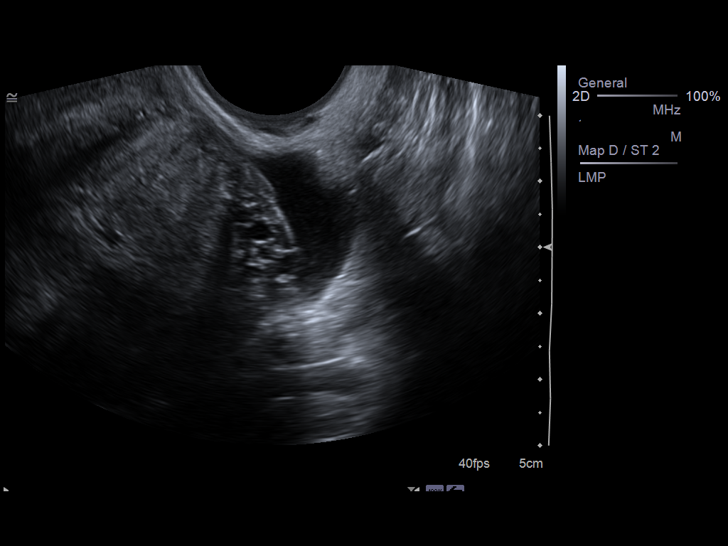
[im 52/63]
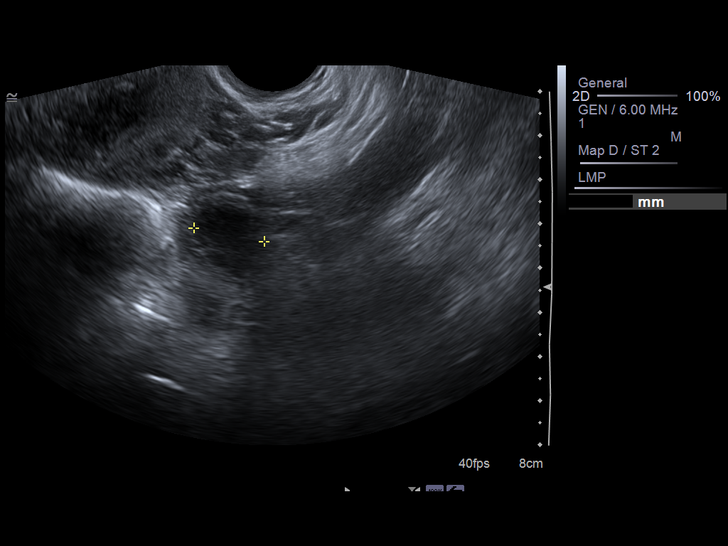
[im 57/63]
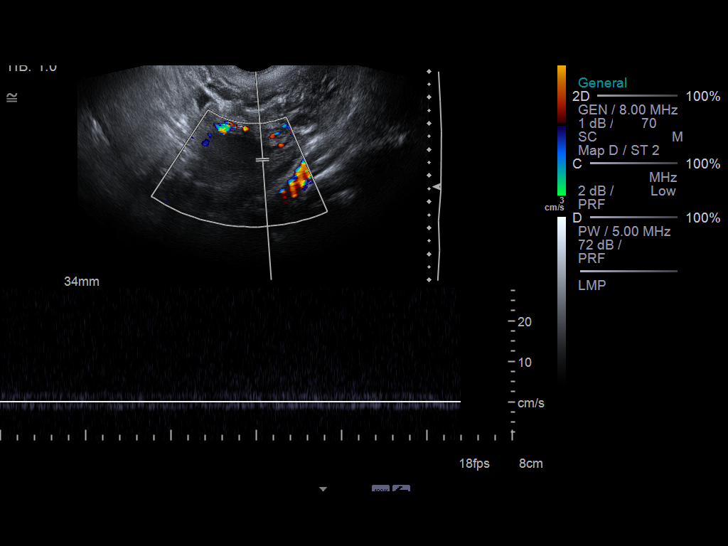
[im 63/63]
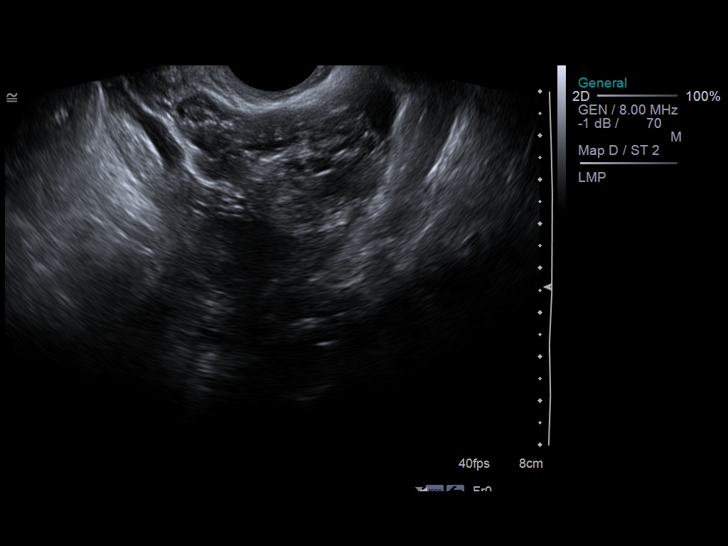

[13 of 25 positions shown; findings below may reference images not displayed]

FINDINGS: Uterus:  The uterus is retroverted and measures 10 x 6.5 x 7.8 cm.
No myometrial mass lesions.

Endometrium: Foreign body demonstrated in the endometrium
consistent with intrauterine device.  Location appears appropriate.
No endometrial stripe thickness is identified.

Right ovary: The right ovary measures 2.5 x 1.8 x 1.9 cm.  Normal
follicular changes are demonstrated.  Flow is demonstrated in the
right ovary on color flow Doppler imaging.

Left ovary: The left ovary measures 4.8 x 3 x 2.2 cm.  Small
complex cyst with internal debris measuring 1.6 x 1.8 x 1.9 cm
likely representing a hemorrhagic cyst.  Flow is demonstrated in
the left ovary on color flow Doppler imaging.

Pulsed Doppler evaluation demonstrates normal arterial and venous
flow velocity waveforms in both ovaries.

Other Findings:  Small amount of free fluid demonstrated in the
pelvis and around the left adnexal region.  This is likely to be
physiologic.
IMPRESSION: Normal study.  No evidence of pelvic mass or other significant
abnormality.

No sonographic evidence for ovarian torsion.

## 2013-11-11 LAB — OB RESULTS CONSOLE GC/CHLAMYDIA
CHLAMYDIA, DNA PROBE: NEGATIVE
GC PROBE AMP, GENITAL: NEGATIVE

## 2013-11-11 LAB — OB RESULTS CONSOLE HEPATITIS B SURFACE ANTIGEN: Hepatitis B Surface Ag: NEGATIVE

## 2013-11-11 LAB — OB RESULTS CONSOLE ABO/RH: RH TYPE: POSITIVE

## 2013-11-11 LAB — OB RESULTS CONSOLE RPR: RPR: NONREACTIVE

## 2013-11-11 LAB — OB RESULTS CONSOLE HIV ANTIBODY (ROUTINE TESTING): HIV: NONREACTIVE

## 2013-11-11 LAB — OB RESULTS CONSOLE ANTIBODY SCREEN: Antibody Screen: NEGATIVE

## 2013-11-11 LAB — OB RESULTS CONSOLE RUBELLA ANTIBODY, IGM: Rubella: IMMUNE

## 2013-11-28 DIAGNOSIS — O24419 Gestational diabetes mellitus in pregnancy, unspecified control: Secondary | ICD-10-CM

## 2013-11-28 HISTORY — DX: Gestational diabetes mellitus in pregnancy, unspecified control: O24.419

## 2013-11-28 NOTE — L&D Delivery Note (Signed)
Delivery Note At 11:10 AM a viable and healthy female was delivered via Vaginal, Spontaneous Delivery (Presentation: Left Occiput Anterior).  APGAR: 9, 9; weight P.   Placenta status: Intact, Spontaneous.  Cord: 3 vessels with the following complications: None.    Anesthesia: Epidural  Episiotomy: None Lacerations: 2nd degree;Perineal Suture Repair: 3.0 vicryl rapide Est. Blood Loss (mL): 300cc  Mom to postpartum.  Baby to Couplet care / Skin to Skin.  Bovard-Stuckert, Makayla Lopez 05/29/2014, 11:32 AM  A+/Br/ Contra- BTL/ RI/Tdap  Pt desires BTL - d/w pt r/b/a - including bleeding, infection, damage to surrounding organs, risk of failure.  Will do this PM

## 2014-04-02 ENCOUNTER — Encounter: Payer: 59 | Attending: Obstetrics and Gynecology

## 2014-04-02 VITALS — Ht 66.0 in | Wt 184.9 lb

## 2014-04-02 DIAGNOSIS — O99810 Abnormal glucose complicating pregnancy: Secondary | ICD-10-CM | POA: Insufficient documentation

## 2014-04-02 DIAGNOSIS — Z713 Dietary counseling and surveillance: Secondary | ICD-10-CM | POA: Insufficient documentation

## 2014-04-03 NOTE — Progress Notes (Signed)
  Patient was seen on 04/02/14 for Gestational Diabetes self-management class at the [facility]. The following learning objectives were met by the patient during this course:   States the definition of Gestational Diabetes  States why dietary management is important in controlling blood glucose  Describes the effects of carbohydrates on blood glucose levels  Demonstrates ability to create a balanced meal plan  Demonstrates carbohydrate counting   States when to check blood glucose levels  Demonstrates proper blood glucose monitoring techniques  States the effect of stress and exercise on blood glucose levels  States the importance of limiting caffeine and abstaining from alcohol and smoking  Plan:  Aim for 2 Carb Choices per meal (30 grams) +/- 1 either way for breakfast Aim for 3 Carb Choices per meal (45 grams) +/- 1 either way from lunch and dinner Aim for 1-2 Carbs per snack Begin reading food labels for Total Carbohydrate and sugar grams of foods Consider  increasing your activity level by walking daily as tolerated Begin checking BG before breakfast and 1-2 hours after first bit of breakfast, lunch and dinner after  as directed by MD  Take medication  as directed by MD  Blood glucose monitor given: Accu Chek Nano BG Monitoring Kit Lot # [id number] Exp: 02/26/2015 Blood glucose reading: $RemoveBeforeDE'[id number]$ /dl  Patient instructed to monitor glucose levels: FBS: 60 - <90 1 hour: <140  Patient received the following handouts:  Nutrition Diabetes and Pregnancy  Carbohydrate Counting List  Meal Planning worksheet  Patient will be seen for follow-up as needed.

## 2014-05-05 LAB — OB RESULTS CONSOLE GBS: STREP GROUP B AG: POSITIVE

## 2014-05-20 ENCOUNTER — Inpatient Hospital Stay (HOSPITAL_COMMUNITY)
Admission: AD | Admit: 2014-05-20 | Discharge: 2014-05-20 | Disposition: A | Discharge status: Home / Self Care | Payer: 59 | Source: Ambulatory Visit | Attending: Obstetrics and Gynecology | Admitting: Obstetrics and Gynecology

## 2014-05-20 ENCOUNTER — Encounter (HOSPITAL_COMMUNITY): Payer: Self-pay

## 2014-05-20 DIAGNOSIS — O368190 Decreased fetal movements, unspecified trimester, not applicable or unspecified: Secondary | ICD-10-CM | POA: Insufficient documentation

## 2014-05-20 DIAGNOSIS — O3090 Multiple gestation, unspecified, unspecified trimester: Secondary | ICD-10-CM

## 2014-05-20 DIAGNOSIS — E7800 Pure hypercholesterolemia, unspecified: Secondary | ICD-10-CM | POA: Insufficient documentation

## 2014-05-20 DIAGNOSIS — O10019 Pre-existing essential hypertension complicating pregnancy, unspecified trimester: Secondary | ICD-10-CM | POA: Insufficient documentation

## 2014-05-20 DIAGNOSIS — O368131 Decreased fetal movements, third trimester, fetus 1: Secondary | ICD-10-CM

## 2014-05-20 DIAGNOSIS — O99810 Abnormal glucose complicating pregnancy: Secondary | ICD-10-CM | POA: Insufficient documentation

## 2014-05-20 DIAGNOSIS — O133 Gestational [pregnancy-induced] hypertension without significant proteinuria, third trimester: Secondary | ICD-10-CM

## 2014-05-20 DIAGNOSIS — Z87891 Personal history of nicotine dependence: Secondary | ICD-10-CM | POA: Insufficient documentation

## 2014-05-20 DIAGNOSIS — Z833 Family history of diabetes mellitus: Secondary | ICD-10-CM | POA: Insufficient documentation

## 2014-05-20 HISTORY — DX: Gestational diabetes mellitus in pregnancy, unspecified control: O24.419

## 2014-05-20 NOTE — MAU Note (Signed)
Decreased movement today. No LOF, vaginal bleeding, regular contractions.

## 2014-05-20 NOTE — Discharge Instructions (Signed)
Hypertension During Pregnancy Hypertension, or high blood pressure, is when there is extra pressure inside your blood vessels that carry blood from the heart to the rest of your body (arteries). It can happen at any time in life, including pregnancy. Hypertension during pregnancy can cause problems for you and your baby. Your baby might not weigh as much as he or she should at birth or might be born early (premature). Very bad cases of hypertension during pregnancy can be life threatening.  Different types of hypertension can occur during pregnancy. These include:  Chronic hypertension. This happens when a woman has hypertension before pregnancy and it continues during pregnancy.  Gestational hypertension. This is when hypertension develops during pregnancy.  Preeclampsia or toxemia of pregnancy. This is a very serious type of hypertension that develops only during pregnancy. It affects the whole body and can be very dangerous for both mother and baby.  Gestational hypertension and preeclampsia usually go away after your baby is born. Your blood pressure will likely stabilize within 6 weeks. Women who have hypertension during pregnancy have a greater chance of developing hypertension later in life or with future pregnancies. RISK FACTORS There are certain factors that make it more likely for you to develop hypertension during pregnancy. These include:  Having hypertension before pregnancy.  Having hypertension during a previous pregnancy.  Being overweight.  Being older than 40 years.  Being pregnant with more than one baby.  Having diabetes or kidney problems. SIGNS AND SYMPTOMS Chronic and gestational hypertension rarely cause symptoms. Preeclampsia has symptoms, which may include:  Increased protein in your urine. Your health care provider will check for this at every prenatal visit.  Swelling of your hands and face.  Rapid weight gain.  Headaches.  Visual changes.  Being  bothered by light.  Abdominal pain, especially in the upper right area.  Chest pain.  Shortness of breath.  Increased reflexes.  Seizures. These occur with a more severe form of preeclampsia, called eclampsia. DIAGNOSIS  You may be diagnosed with hypertension during a regular prenatal exam. At each prenatal visit, you may have:  Your blood pressure checked.  A urine test to check for protein in your urine. The type of hypertension you are diagnosed with depends on when you developed it. It also depends on your specific blood pressure reading.  Developing hypertension before 20 weeks of pregnancy is consistent with chronic hypertension.  Developing hypertension after 20 weeks of pregnancy is consistent with gestational hypertension.  Hypertension with increased urinary protein is diagnosed as preeclampsia.  Blood pressure measurements that stay above 170 systolic or 017 diastolic are a sign of severe preeclampsia. TREATMENT Treatment for hypertension during pregnancy varies. Treatment depends on the type of hypertension and how serious it is.  If you take medicine for chronic hypertension, you may need to switch medicines.  Medicines called ACE inhibitors should not be taken during pregnancy.  Low-dose aspirin may be suggested for women who have risk factors for preeclampsia.  If you have gestational hypertension, you may need to take a blood pressure medicine that is safe during pregnancy. Your health care provider will recommend the correct medicine.  If you have severe preeclampsia, you may need to be in the hospital. Health care providers will watch you and your baby very closely. You also may need to take medicine called magnesium sulfate to prevent seizures and lower blood pressure.  Sometimes, an early delivery is needed. This may be the case if the condition worsens. It would  be done to protect you and your baby. The only cure for preeclampsia is delivery.  Your health  care provider may recommend that you take one low-dose aspirin (81 mg) each day to help prevent high blood pressure during your pregnancy if you are at risk for preeclampsia. You may be at risk for preeclampsia if:  You had preeclampsia or eclampsia during a previous pregnancy.  Your baby did not grow as expected during a previous pregnancy.  You experienced preterm birth with a previous pregnancy.  You experienced a separation of the placenta from the uterus (placental abruption) during a previous pregnancy.  You experienced the loss of your baby during a previous pregnancy.  You are pregnant with more than one baby.  You have other medical conditions, such as diabetes or an autoimmune disease. HOME CARE INSTRUCTIONS  Schedule and keep all of your regular prenatal care appointments.  Only take medicines as directed by your health care provider. Tell your health care provider about all medicines you take.  Eat as little salt as possible.  Get regular exercise.  Do not drink alcohol.  Do not use tobacco products.  Do not drink products with caffeine.  Lie on your left side when resting. SEEK IMMEDIATE MEDICAL CARE IF:  You have severe abdominal pain.  You have sudden swelling in your hands, ankles, or face.  You gain 4 pounds (1.8 kg) or more in 1 week.  You vomit repeatedly.  You have vaginal bleeding.  You do not feel your baby moving as much.  You have a headache.  You have blurred or double vision.  You have muscle twitching or spasms.  You have shortness of breath.  You have blue fingernails or lips.  You have blood in your urine. MAKE SURE YOU:  Understand these instructions.  Will watch your condition.  Will get help right away if you are not doing well or get worse. Document Released: 08/02/2011 Document Revised: 11/19/2013 Document Reviewed: 06/13/2013 [facility] Patient Information 2015 [place], [place]. This information is not intended to  replace advice given to you by your health care provider. Make sure you discuss any questions you have with your health care provider.   Fetal Movement Counts Patient Name: __________________________________________________ Patient Due Date: ____________________ Performing a fetal movement count is highly recommended in high-risk pregnancies, but it is good for every pregnant woman to do. Your caregiver may ask you to start counting fetal movements at 28 weeks of the pregnancy. Fetal movements often increase:  After eating a full meal.  After physical activity.  After eating or drinking something sweet or cold.  At rest. Pay attention to when you feel the baby is most active. This will help you notice a pattern of your baby's sleep and wake cycles and what factors contribute to an increase in fetal movement. It is important to perform a fetal movement count at the same time each day when your baby is normally most active.  HOW TO COUNT FETAL MOVEMENTS 1. Find a quiet and comfortable area to sit or lie down on your left side. Lying on your left side provides the best blood and oxygen circulation to your baby. 2. Write down the day and time on a sheet of paper or in a journal. 3. Start counting kicks, flutters, swishes, rolls, or jabs in a 2 hour period. You should feel at least 10 movements within 2 hours. 4. If you do not feel 10 movements in 2 hours, wait 2-3 hours and count  again. Look for a change in the pattern or not enough counts in 2 hours. SEEK MEDICAL CARE IF:  You feel less than 10 counts in 2 hours, tried twice.  There is no movement in over an hour.  The pattern is changing or taking longer each day to reach 10 counts in 2 hours.  You feel the baby is not moving as he or she usually does. Date: ____________ Movements: ____________ Start time: ____________ Doreatha MartinFinish time: ____________  Date: ____________ Movements: ____________ Start time: ____________ Doreatha MartinFinish time:  ____________ Date: ____________ Movements: ____________ Start time: ____________ Doreatha MartinFinish time: ____________ Date: ____________ Movements: ____________ Start time: ____________ Doreatha MartinFinish time: ____________ Date: ____________ Movements: ____________ Start time: ____________ Doreatha MartinFinish time: ____________ Date: ____________ Movements: ____________ Start time: ____________ Doreatha MartinFinish time: ____________ Date: ____________ Movements: ____________ Start time: ____________ Doreatha MartinFinish time: ____________ Date: ____________ Movements: ____________ Start time: ____________ Doreatha MartinFinish time: ____________  Date: ____________ Movements: ____________ Start time: ____________ Doreatha MartinFinish time: ____________ Date: ____________ Movements: ____________ Start time: ____________ Doreatha MartinFinish time: ____________ Date: ____________ Movements: ____________ Start time: ____________ Doreatha MartinFinish time: ____________ Date: ____________ Movements: ____________ Start time: ____________ Doreatha MartinFinish time: ____________ Date: ____________ Movements: ____________ Start time: ____________ Doreatha MartinFinish time: ____________ Date: ____________ Movements: ____________ Start time: ____________ Doreatha MartinFinish time: ____________ Date: ____________ Movements: ____________ Start time: ____________ Doreatha MartinFinish time: ____________  Date: ____________ Movements: ____________ Start time: ____________ Doreatha MartinFinish time: ____________ Date: ____________ Movements: ____________ Start time: ____________ Doreatha MartinFinish time: ____________ Date: ____________ Movements: ____________ Start time: ____________ Doreatha MartinFinish time: ____________ Date: ____________ Movements: ____________ Start time: ____________ Doreatha MartinFinish time: ____________ Date: ____________ Movements: ____________ Start time: ____________ Doreatha MartinFinish time: ____________ Date: ____________ Movements: ____________ Start time: ____________ Doreatha MartinFinish time: ____________ Date: ____________ Movements: ____________ Start time: ____________ Doreatha MartinFinish time: ____________  Date: ____________ Movements:  ____________ Start time: ____________ Doreatha MartinFinish time: ____________ Date: ____________ Movements: ____________ Start time: ____________ Doreatha MartinFinish time: ____________ Date: ____________ Movements: ____________ Start time: ____________ Doreatha MartinFinish time: ____________ Date: ____________ Movements: ____________ Start time: ____________ Doreatha MartinFinish time: ____________ Date: ____________ Movements: ____________ Start time: ____________ Doreatha MartinFinish time: ____________ Date: ____________ Movements: ____________ Start time: ____________ Doreatha MartinFinish time: ____________ Date: ____________ Movements: ____________ Start time: ____________ Doreatha MartinFinish time: ____________  Date: ____________ Movements: ____________ Start time: ____________ Doreatha MartinFinish time: ____________ Date: ____________ Movements: ____________ Start time: ____________ Doreatha MartinFinish time: ____________ Date: ____________ Movements: ____________ Start time: ____________ Doreatha MartinFinish time: ____________ Date: ____________ Movements: ____________ Start time: ____________ Doreatha MartinFinish time: ____________ Date: ____________ Movements: ____________ Start time: ____________ Doreatha MartinFinish time: ____________ Date: ____________ Movements: ____________ Start time: ____________ Doreatha MartinFinish time: ____________ Date: ____________ Movements: ____________ Start time: ____________ Doreatha MartinFinish time: ____________  Date: ____________ Movements: ____________ Start time: ____________ Doreatha MartinFinish time: ____________ Date: ____________ Movements: ____________ Start time: ____________ Doreatha MartinFinish time: ____________ Date: ____________ Movements: ____________ Start time: ____________ Doreatha MartinFinish time: ____________ Date: ____________ Movements: ____________ Start time: ____________ Doreatha MartinFinish time: ____________ Date: ____________ Movements: ____________ Start time: ____________ Doreatha MartinFinish time: ____________ Date: ____________ Movements: ____________ Start time: ____________ Doreatha MartinFinish time: ____________ Date: ____________ Movements: ____________ Start time: ____________ Doreatha MartinFinish  time: ____________  Date: ____________ Movements: ____________ Start time: ____________ Doreatha MartinFinish time: ____________ Date: ____________ Movements: ____________ Start time: ____________ Doreatha MartinFinish time: ____________ Date: ____________ Movements: ____________ Start time: ____________ Doreatha MartinFinish time: ____________ Date: ____________ Movements: ____________ Start time: ____________ Doreatha MartinFinish time: ____________ Date: ____________ Movements: ____________ Start time: ____________ Doreatha MartinFinish time: ____________ Date: ____________ Movements: ____________ Start time: ____________ Doreatha MartinFinish time: ____________ Date: ____________ Movements: ____________ Start time: ____________ Doreatha MartinFinish time: ____________  Date: ____________ Movements: ____________ Start time: ____________ Doreatha MartinFinish time: ____________ Date: ____________ Movements: ____________ Start time: ____________ Doreatha MartinFinish  time: ____________ Date: ____________ Movements: ____________ Start time: ____________ Doreatha MartinFinish time: ____________ Date: ____________ Movements: ____________ Start time: ____________ Doreatha MartinFinish time: ____________ Date: ____________ Movements: ____________ Start time: ____________ Doreatha MartinFinish time: ____________ Date: ____________ Movements: ____________ Start time: ____________ Doreatha MartinFinish time: ____________ Document Released: 12/14/2006 Document Revised: 10/31/2012 Document Reviewed: 09/10/2012 [employer] Patient Information 2015 [place]. This information is not intended to replace advice given to you by your health care provider. Make sure you discuss any questions you have with your health care provider.

## 2014-05-20 NOTE — MAU Provider Note (Signed)
Chief Complaint:  Decreased Fetal Movement   First Provider Initiated Contact with Patient 05/20/14 2028     HPI: [name] is a 38 y.o. [id number] at [id number] who presents to maternity admissions reporting decreased fetal movement. Denies contractions, leakage of fluid or vaginal bleeding.   Pregnancy Course: A1 GDM diet controlled  Past Medical History: Past Medical History  Diagnosis Date  . Anxiety   . Kidney stone   . Hypercholesteremia   . Gestational diabetes mellitus, antepartum   . Gestational diabetes 2015    diet controlled    Past obstetric history: OB History  Gravida Para Term Preterm AB SAB TAB Ectopic Multiple Living  5 4 4  0 0 0 0 0 0 4    # Outcome Date GA Lbr Len/2nd Weight Sex Delivery Anes PTL Lv  5 CUR           4 TRM           3 TRM           2 TRM           1 TRM               Past Surgical History: Past Surgical History  Procedure Laterality Date  . No past surgeries       Family History: Family History  Problem Relation Age of Onset  . Diabetes Mother   . Hyperlipidemia Father   . Hypertension Father   . Cancer Paternal Grandfather     Social History: History  Substance Use Topics  . Smoking status: Former Games developermoker  . Smokeless tobacco: Not on file  . Alcohol Use: No    Allergies: No Known Allergies  Meds:  Prescriptions prior to admission  Medication Sig Dispense Refill  . diphenhydramine-acetaminophen (TYLENOL PM) 25-500 MG TABS Take 2 tablets by mouth at bedtime as needed (For sleep.).      Marland Kitchen. Prenatal Vit-Fe Fumarate-FA (PRENATAL MULTIVITAMIN) TABS tablet Take 1 tablet by mouth daily at 12 noon.        ROS: Pertinent findings in history of present illness.  Physical Exam  Blood pressure 131/81, pulse 88, resp. rate 20, SpO2 99.00%. Patient Vitals for the past 24 hrs:  BP Pulse Resp SpO2  05/20/14 2017 - 88 - 99 %  05/20/14 2012 - 87 - -  05/20/14 2011 131/81 mmHg 88 - 99 %  05/20/14 2007 - 76 - 95 %  05/20/14 2006  137/93 mmHg 102 20 -    GENERAL: Well-developed, well-nourished female in no acute distress.  HEENT: normocephalic HEART: normal rate RESP: normal effort ABDOMEN: Soft, non-tender, gravid appropriate for gestational age EXTREMITIES: Nontender, no edema NEURO: alert and oriented SPECULUM EXAM: deferred   FHT:  Baseline 103 , moderate variability, accelerations present, no decelerations Contractions: q 2-5 mins, mild   Labs: No results found for this or any previous visit (from the past 24 hour(s)).  Imaging:  No results found. MAU Course:   Assessment: 1. Decreased fetal movement in pregnancy in third trimester, antepartum, fetus 1   2. Hypertension in pregnancy, transient, antepartum, third trimester    Plan: Discharge home in stable condition per consult w/ Dr. Ellyn HackBovard. Pre-E precautions.  Labor precautions and fetal kick counts     Follow-up Information   Follow up with Bovard-Stuckert, Augusto GambleJody, MD. (As scheduled or as needed if symptoms worsen)    Specialty:  Obstetrics and Gynecology   Contact information:   [address] [place] [address] [phone]       [facility]. (As needed if symptoms worsen)    Contact information:   [id number] [place] [address] [phone]       Medication List    TAKE these medications       prenatal multivitamin Tabs tablet  Take 1 tablet by mouth daily at 12 noon.      ASK your doctor about these medications       diphenhydramine-acetaminophen 25-500 MG Tabs  Commonly known as:  TYLENOL PM  Take 2 tablets by mouth at bedtime as needed (For sleep.).        [place], [place] 05/20/2014 8:36 PM

## 2014-05-28 ENCOUNTER — Inpatient Hospital Stay (HOSPITAL_COMMUNITY)
Admission: AD | Admit: 2014-05-28 | Discharge: 2014-05-31 | DRG: 767 | Disposition: A | Discharge status: Home / Self Care | Payer: 59 | Source: Ambulatory Visit | Attending: Obstetrics and Gynecology | Admitting: Obstetrics and Gynecology

## 2014-05-28 ENCOUNTER — Other Ambulatory Visit: Payer: Self-pay | Admitting: Obstetrics and Gynecology

## 2014-05-28 ENCOUNTER — Encounter (HOSPITAL_COMMUNITY): Payer: Self-pay | Admitting: Obstetrics

## 2014-05-28 DIAGNOSIS — O99814 Abnormal glucose complicating childbirth: Principal | ICD-10-CM | POA: Diagnosis present

## 2014-05-28 DIAGNOSIS — Z302 Encounter for sterilization: Secondary | ICD-10-CM

## 2014-05-28 DIAGNOSIS — O24410 Gestational diabetes mellitus in pregnancy, diet controlled: Secondary | ICD-10-CM | POA: Diagnosis present

## 2014-05-28 DIAGNOSIS — O9902 Anemia complicating childbirth: Secondary | ICD-10-CM | POA: Diagnosis present

## 2014-05-28 DIAGNOSIS — D649 Anemia, unspecified: Secondary | ICD-10-CM | POA: Diagnosis present

## 2014-05-28 DIAGNOSIS — Z87891 Personal history of nicotine dependence: Secondary | ICD-10-CM

## 2014-05-28 DIAGNOSIS — Z9851 Tubal ligation status: Secondary | ICD-10-CM

## 2014-05-28 DIAGNOSIS — IMO0001 Reserved for inherently not codable concepts without codable children: Secondary | ICD-10-CM

## 2014-05-28 HISTORY — DX: Tubal ligation status: Z98.51

## 2014-05-28 HISTORY — DX: Reserved for inherently not codable concepts without codable children: IMO0001

## 2014-05-28 LAB — CBC
HCT: 30.4 % — ABNORMAL LOW (ref 36.0–46.0)
Hemoglobin: 9.8 g/dL — ABNORMAL LOW (ref 12.0–15.0)
MCH: 28.6 pg (ref 26.0–34.0)
MCHC: 32.2 g/dL (ref 30.0–36.0)
MCV: 88.6 fL (ref 78.0–100.0)
Platelets: 176 10*3/uL (ref 150–400)
RBC: 3.43 MIL/uL — ABNORMAL LOW (ref 3.87–5.11)
RDW: 12.8 % (ref 11.5–15.5)
WBC: 8.6 10*3/uL (ref 4.0–10.5)

## 2014-05-28 MED ORDER — LACTATED RINGERS IV SOLN: 500.0000 mL | INTRAVENOUS | Status: DC | PRN

## 2014-05-28 MED ORDER — PENICILLIN G POTASSIUM 5000000 UNITS IJ SOLR
5.0000 10*6.[IU] | Freq: Once | INTRAVENOUS | Status: AC
  Administered 2014-05-28: 5 10*6.[IU] via INTRAVENOUS
  Filled 2014-05-28: qty 5

## 2014-05-28 MED ORDER — LACTATED RINGERS IV SOLN
INTRAVENOUS | Status: DC
  Administered 2014-05-28 – 2014-05-29 (×3): via INTRAVENOUS

## 2014-05-28 MED ORDER — LIDOCAINE HCL (PF) 1 % IJ SOLN
30.0000 mL | INTRAMUSCULAR | Status: DC | PRN
  Filled 2014-05-28: qty 30

## 2014-05-28 MED ORDER — DIPHENHYDRAMINE HCL 50 MG/ML IJ SOLN: 12.5000 mg | INTRAMUSCULAR | Status: DC | PRN

## 2014-05-28 MED ORDER — PENICILLIN G POTASSIUM 5000000 UNITS IJ SOLR
2.5000 10*6.[IU] | INTRAMUSCULAR | Status: DC
  Administered 2014-05-29 (×3): 2.5 10*6.[IU] via INTRAVENOUS
  Filled 2014-05-28 (×8): qty 2.5

## 2014-05-28 MED ORDER — IBUPROFEN 600 MG PO TABS
600.0000 mg | ORAL_TABLET | Freq: Four times a day (QID) | ORAL | Status: DC | PRN
  Administered 2014-05-29: 600 mg via ORAL
  Filled 2014-05-28: qty 1

## 2014-05-28 MED ORDER — LACTATED RINGERS IV SOLN
500.0000 mL | Freq: Once | INTRAVENOUS | Status: AC
  Administered 2014-05-29: 500 mL via INTRAVENOUS

## 2014-05-28 MED ORDER — ACETAMINOPHEN 325 MG PO TABS: 650.0000 mg | ORAL_TABLET | ORAL | Status: DC | PRN

## 2014-05-28 MED ORDER — PHENYLEPHRINE 40 MCG/ML (10ML) SYRINGE FOR IV PUSH (FOR BLOOD PRESSURE SUPPORT)
80.0000 ug | PREFILLED_SYRINGE | INTRAVENOUS | Status: DC | PRN
  Filled 2014-05-28: qty 2

## 2014-05-28 MED ORDER — OXYTOCIN 40 UNITS IN LACTATED RINGERS INFUSION - SIMPLE MED
1.0000 m[IU]/min | INTRAVENOUS | Status: DC
  Administered 2014-05-28: 1 m[IU]/min via INTRAVENOUS
  Filled 2014-05-28: qty 1000

## 2014-05-28 MED ORDER — OXYTOCIN 40 UNITS IN LACTATED RINGERS INFUSION - SIMPLE MED
62.5000 mL/h | INTRAVENOUS | Status: DC
  Administered 2014-05-29: 62.5 mL/h via INTRAVENOUS

## 2014-05-28 MED ORDER — ONDANSETRON HCL 4 MG/2ML IJ SOLN: 4.0000 mg | Freq: Four times a day (QID) | INTRAMUSCULAR | Status: DC | PRN

## 2014-05-28 MED ORDER — EPHEDRINE 5 MG/ML INJ
10.0000 mg | INTRAVENOUS | Status: DC | PRN
  Filled 2014-05-28: qty 2

## 2014-05-28 MED ORDER — BUTORPHANOL TARTRATE 1 MG/ML IJ SOLN
1.0000 mg | INTRAMUSCULAR | Status: DC | PRN
  Administered 2014-05-29: 1 mg via INTRAVENOUS
  Filled 2014-05-28: qty 1

## 2014-05-28 MED ORDER — CITRIC ACID-SODIUM CITRATE 334-500 MG/5ML PO SOLN: 30.0000 mL | ORAL | Status: DC | PRN

## 2014-05-28 MED ORDER — OXYCODONE-ACETAMINOPHEN 5-325 MG PO TABS
1.0000 | ORAL_TABLET | ORAL | Status: DC | PRN
Start: 2014-05-28 — End: 2014-05-29

## 2014-05-28 MED ORDER — PHENYLEPHRINE 40 MCG/ML (10ML) SYRINGE FOR IV PUSH (FOR BLOOD PRESSURE SUPPORT)
80.0000 ug | PREFILLED_SYRINGE | INTRAVENOUS | Status: DC | PRN
  Filled 2014-05-28: qty 10
  Filled 2014-05-28: qty 2

## 2014-05-28 MED ORDER — TERBUTALINE SULFATE 1 MG/ML IJ SOLN: 0.2500 mg | Freq: Once | INTRAMUSCULAR | Status: AC | PRN

## 2014-05-28 MED ORDER — OXYTOCIN BOLUS FROM INFUSION
500.0000 mL | INTRAVENOUS | Status: DC
  Administered 2014-05-29: 500 mL via INTRAVENOUS

## 2014-05-28 MED ORDER — FENTANYL 2.5 MCG/ML BUPIVACAINE 1/10 % EPIDURAL INFUSION (WH - ANES)
14.0000 mL/h | INTRAMUSCULAR | Status: DC | PRN
  Administered 2014-05-29: 14 mL/h via EPIDURAL
  Filled 2014-05-28: qty 125

## 2014-05-28 MED ORDER — ZOLPIDEM TARTRATE 5 MG PO TABS
5.0000 mg | ORAL_TABLET | Freq: Every evening | ORAL | Status: DC | PRN
  Administered 2014-05-28: 5 mg via ORAL
  Filled 2014-05-28: qty 1

## 2014-05-28 NOTE — H&P (Signed)
Makayla CollardBetsy Lopez is a 38 y.o. female  G5P4004 at 5439 for IOL given term, favorable and GDM.  PCN overnight for GBBS +, then ROM in AM,  Desires BTL - B salpingectomy i possible.  D/W pt r/b/a of IOL and BTL.  Pregnancy complicated by GDM, diet controlled.  Declined genetic screening, nl US  Maternal Medical History:  Contractions: Frequency: irregular.   Perceived severity is mild.    Fetal activity: Perceived fetal activity is normal.    Prenatal Complications - Diabetes: gestational. Diabetes is managed by diet.      OB History   Grav Para Term Preterm Abortions TAB SAB Ect Mult Living   5 4 4  0 0 0 0 0 0 4    G1-G4 SVD, G1-3 female, G4 female, 7#8-8#10; no abn pap, no STD G4 with kidney stone  Past Medical History  Diagnosis Date  . Anxiety   . Kidney stone   . Hypercholesteremia   . Gestational diabetes mellitus, antepartum   . Gestational diabetes 2015    diet controlled   Past Surgical History  Procedure Laterality Date  . No past surgeries     Family History: family history includes Cancer in her paternal grandfather; Diabetes in her mother; Hyperlipidemia in her father; Hypertension in her father. Social History:  reports that she has quit smoking. She does not have any smokeless tobacco history on file. She reports that she does not drink alcohol or use illicit drugs.married, RN Meds PNV All NKDA   Prenatal Transfer Tool  Maternal Diabetes: Yes:  Diabetes Type:  Diet controlled Genetic Screening: Declined Maternal Ultrasounds/Referrals: Normal Fetal Ultrasounds or other Referrals:  None Maternal Substance Abuse:  No Significant Maternal Medications:  None Significant Maternal Lab Results:  Lab values include: Group B Strep positive Other Comments:  None  Review of Systems  Constitutional: Negative.   HENT: Negative.   Eyes: Negative.   Respiratory: Negative.   Cardiovascular: Negative.   Gastrointestinal: Negative.   Genitourinary: Negative.   Musculoskeletal:  Negative.   Skin: Negative.   Neurological: Negative.   Psychiatric/Behavioral: Negative.       There were no vitals taken for this visit. Maternal Exam:  Uterine Assessment: Contraction frequency is irregular.   Abdomen: Fundal height is appropriate for gestation.   Estimated fetal weight is 8-9#.   Fetal presentation: vertex  Introitus: Normal vulva. Normal vagina.  Pelvis: adequate for delivery.   Cervix: Cervix evaluated by digital exam.     Physical Exam  Constitutional: She is oriented to person, place, and time. She appears well-developed and well-nourished.  HENT:  Head: Normocephalic and atraumatic.  Cardiovascular: Normal rate and regular rhythm.   Respiratory: Effort normal and breath sounds normal. No respiratory distress. She has no wheezes.  GI: Soft. Bowel sounds are normal. She exhibits no distension. There is no tenderness.  Musculoskeletal: Normal range of motion.  Neurological: She is alert and oriented to person, place, and time.  Skin: Skin is warm and dry.  Psychiatric: She has a normal mood and affect. Her behavior is normal.    Prenatal labs: ABO, Rh:  A+ Antibody:  neg Rubella:  immune RPR:   NR HBsAg:   neg HIV:   neg GBS:   positive  Flu 10/14, Tdap 03/24/14  [facility]  Hgb 12.1/Plt 279K/Ur Cx neg/GC neg.Chl neg/Pap WNL HR HPV neg/glucola 158 - 3hr GTT GDM/ CF declined, screening declined  Nl anat, postplac, ?gender (female)  Assessment/Plan: [phone] [id number]  [id number] for IOL GDM monitor FSBS GBBS + - PCN IOL with AROM and pitocin Plan for BTL   Bovard-Stuckert, Delwyn Scoggin 05/28/2014, 5:23 PM

## 2014-05-29 ENCOUNTER — Inpatient Hospital Stay (HOSPITAL_COMMUNITY): Payer: 59 | Admitting: Anesthesiology

## 2014-05-29 ENCOUNTER — Encounter (HOSPITAL_COMMUNITY): Payer: 59 | Admitting: Anesthesiology

## 2014-05-29 ENCOUNTER — Encounter (HOSPITAL_COMMUNITY)
Admission: AD | Disposition: A | Discharge status: Home / Self Care | Payer: Self-pay | Source: Ambulatory Visit | Attending: Obstetrics and Gynecology

## 2014-05-29 ENCOUNTER — Encounter (HOSPITAL_COMMUNITY): Payer: Self-pay | Admitting: *Deleted

## 2014-05-29 DIAGNOSIS — IMO0001 Reserved for inherently not codable concepts without codable children: Secondary | ICD-10-CM

## 2014-05-29 DIAGNOSIS — Z9851 Tubal ligation status: Secondary | ICD-10-CM

## 2014-05-29 HISTORY — PX: TUBAL LIGATION: SHX77

## 2014-05-29 HISTORY — DX: Reserved for inherently not codable concepts without codable children: IMO0001

## 2014-05-29 HISTORY — DX: Tubal ligation status: Z98.51

## 2014-05-29 LAB — GLUCOSE, CAPILLARY
GLUCOSE-CAPILLARY: 72 mg/dL (ref 70–99)
GLUCOSE-CAPILLARY: 77 mg/dL (ref 70–99)
GLUCOSE-CAPILLARY: 84 mg/dL (ref 70–99)
Glucose-Capillary: 73 mg/dL (ref 70–99)

## 2014-05-29 LAB — TYPE AND SCREEN
ABO/RH(D): A POS
ANTIBODY SCREEN: NEGATIVE

## 2014-05-29 LAB — ABO/RH: ABO/RH(D): A POS

## 2014-05-29 LAB — RPR

## 2014-05-29 LAB — MRSA PCR SCREENING: MRSA by PCR: NEGATIVE

## 2014-05-29 SURGERY — LIGATION, FALLOPIAN TUBE, POSTPARTUM
Anesthesia: Epidural | Laterality: Bilateral

## 2014-05-29 MED ORDER — ONDANSETRON HCL 4 MG/2ML IJ SOLN
INTRAMUSCULAR | Status: AC
  Filled 2014-05-29: qty 2

## 2014-05-29 MED ORDER — DIPHENHYDRAMINE HCL 25 MG PO CAPS: 25.0000 mg | ORAL_CAPSULE | Freq: Four times a day (QID) | ORAL | Status: DC | PRN

## 2014-05-29 MED ORDER — MIDAZOLAM HCL 5 MG/5ML IJ SOLN
INTRAMUSCULAR | Status: DC | PRN
  Administered 2014-05-29 (×3): 1 mg via INTRAVENOUS

## 2014-05-29 MED ORDER — FENTANYL CITRATE 0.05 MG/ML IJ SOLN
INTRAMUSCULAR | Status: DC | PRN
  Administered 2014-05-29 (×3): 50 ug via INTRAVENOUS

## 2014-05-29 MED ORDER — LIDOCAINE HCL (PF) 1 % IJ SOLN
INTRAMUSCULAR | Status: DC | PRN
  Administered 2014-05-29: 10 mL

## 2014-05-29 MED ORDER — LACTATED RINGERS IV SOLN: INTRAVENOUS | Status: DC

## 2014-05-29 MED ORDER — PHENYLEPHRINE 40 MCG/ML (10ML) SYRINGE FOR IV PUSH (FOR BLOOD PRESSURE SUPPORT)
PREFILLED_SYRINGE | INTRAVENOUS | Status: AC
  Filled 2014-05-29: qty 5

## 2014-05-29 MED ORDER — FENTANYL 2.5 MCG/ML BUPIVACAINE 1/10 % EPIDURAL INFUSION (WH - ANES): 14.0000 mL/h | INTRAMUSCULAR | Status: DC | PRN

## 2014-05-29 MED ORDER — BENZOCAINE-MENTHOL 20-0.5 % EX AERO
1.0000 "application " | INHALATION_SPRAY | CUTANEOUS | Status: DC | PRN
  Filled 2014-05-29: qty 56

## 2014-05-29 MED ORDER — DIBUCAINE 1 % RE OINT: 1.0000 "application " | TOPICAL_OINTMENT | RECTAL | Status: DC | PRN

## 2014-05-29 MED ORDER — ONDANSETRON HCL 4 MG PO TABS: 4.0000 mg | ORAL_TABLET | ORAL | Status: DC | PRN

## 2014-05-29 MED ORDER — FENTANYL CITRATE 0.05 MG/ML IJ SOLN
INTRAMUSCULAR | Status: AC
  Filled 2014-05-29: qty 2

## 2014-05-29 MED ORDER — WITCH HAZEL-GLYCERIN EX PADS
1.0000 | MEDICATED_PAD | CUTANEOUS | Status: DC | PRN
Start: 2014-05-29 — End: 2014-05-31

## 2014-05-29 MED ORDER — KETOROLAC TROMETHAMINE 30 MG/ML IJ SOLN
INTRAMUSCULAR | Status: AC
  Filled 2014-05-29: qty 1

## 2014-05-29 MED ORDER — IBUPROFEN 600 MG PO TABS
600.0000 mg | ORAL_TABLET | Freq: Four times a day (QID) | ORAL | Status: DC
  Administered 2014-05-30 – 2014-05-31 (×6): 600 mg via ORAL
  Filled 2014-05-29 (×6): qty 1

## 2014-05-29 MED ORDER — OXYCODONE-ACETAMINOPHEN 5-325 MG PO TABS
1.0000 | ORAL_TABLET | ORAL | Status: DC | PRN
  Administered 2014-05-29: 1 via ORAL
  Administered 2014-05-29 – 2014-05-31 (×9): 2 via ORAL
  Filled 2014-05-29 (×8): qty 2
  Filled 2014-05-29: qty 1
  Filled 2014-05-29: qty 2

## 2014-05-29 MED ORDER — SODIUM BICARBONATE 8.4 % IV SOLN
INTRAVENOUS | Status: DC | PRN
  Administered 2014-05-29: 3 mL via EPIDURAL
  Administered 2014-05-29: 7 mL via EPIDURAL
  Administered 2014-05-29: 10 mL via EPIDURAL

## 2014-05-29 MED ORDER — ZOLPIDEM TARTRATE 5 MG PO TABS: 5.0000 mg | ORAL_TABLET | Freq: Every evening | ORAL | Status: DC | PRN

## 2014-05-29 MED ORDER — PRENATAL MULTIVITAMIN CH
1.0000 | ORAL_TABLET | Freq: Every day | ORAL | Status: DC
  Administered 2014-05-30 – 2014-05-31 (×2): 1 via ORAL
  Filled 2014-05-29 (×2): qty 1

## 2014-05-29 MED ORDER — LACTATED RINGERS IV SOLN
INTRAVENOUS | Status: DC
  Administered 2014-05-29: 16:00:00 via INTRAVENOUS

## 2014-05-29 MED ORDER — LIDOCAINE-EPINEPHRINE (PF) 2 %-1:200000 IJ SOLN
INTRAMUSCULAR | Status: AC
  Filled 2014-05-29: qty 20

## 2014-05-29 MED ORDER — BUPIVACAINE HCL (PF) 0.25 % IJ SOLN
INTRAMUSCULAR | Status: DC | PRN
  Administered 2014-05-29: 10 mL

## 2014-05-29 MED ORDER — SENNOSIDES-DOCUSATE SODIUM 8.6-50 MG PO TABS
2.0000 | ORAL_TABLET | ORAL | Status: DC
  Administered 2014-05-30 – 2014-05-31 (×2): 2 via ORAL
  Filled 2014-05-29 (×2): qty 2

## 2014-05-29 MED ORDER — MEPERIDINE HCL 25 MG/ML IJ SOLN: 6.2500 mg | INTRAMUSCULAR | Status: DC | PRN

## 2014-05-29 MED ORDER — BUPIVACAINE HCL (PF) 0.25 % IJ SOLN
INTRAMUSCULAR | Status: AC
  Filled 2014-05-29: qty 30

## 2014-05-29 MED ORDER — LANOLIN HYDROUS EX OINT: TOPICAL_OINTMENT | CUTANEOUS | Status: DC | PRN

## 2014-05-29 MED ORDER — SODIUM BICARBONATE 8.4 % IV SOLN
INTRAVENOUS | Status: AC
  Filled 2014-05-29: qty 50

## 2014-05-29 MED ORDER — MIDAZOLAM HCL 2 MG/2ML IJ SOLN
INTRAMUSCULAR | Status: AC
  Filled 2014-05-29: qty 2

## 2014-05-29 MED ORDER — SIMETHICONE 80 MG PO CHEW
80.0000 mg | CHEWABLE_TABLET | ORAL | Status: DC | PRN
  Administered 2014-05-30: 80 mg via ORAL
  Filled 2014-05-29: qty 1

## 2014-05-29 MED ORDER — FAMOTIDINE 20 MG PO TABS
40.0000 mg | ORAL_TABLET | Freq: Once | ORAL | Status: AC
  Administered 2014-05-29: 40 mg via ORAL
  Filled 2014-05-29: qty 2

## 2014-05-29 MED ORDER — KETOROLAC TROMETHAMINE 30 MG/ML IJ SOLN
15.0000 mg | Freq: Once | INTRAMUSCULAR | Status: AC | PRN
  Administered 2014-05-29: 30 mg via INTRAVENOUS

## 2014-05-29 MED ORDER — HYDROMORPHONE HCL PF 1 MG/ML IJ SOLN: 0.2500 mg | INTRAMUSCULAR | Status: DC | PRN

## 2014-05-29 MED ORDER — METOCLOPRAMIDE HCL 10 MG PO TABS
10.0000 mg | ORAL_TABLET | Freq: Once | ORAL | Status: AC
  Administered 2014-05-29: 10 mg via ORAL
  Filled 2014-05-29: qty 1

## 2014-05-29 MED ORDER — ONDANSETRON HCL 4 MG/2ML IJ SOLN: 4.0000 mg | INTRAMUSCULAR | Status: DC | PRN

## 2014-05-29 MED ORDER — ONDANSETRON HCL 4 MG/2ML IJ SOLN
INTRAMUSCULAR | Status: DC | PRN
  Administered 2014-05-29: 4 mg via INTRAVENOUS

## 2014-05-29 MED ORDER — PROMETHAZINE HCL 25 MG/ML IJ SOLN: 6.2500 mg | INTRAMUSCULAR | Status: DC | PRN

## 2014-05-29 MED ORDER — LACTATED RINGERS IV SOLN
INTRAVENOUS | Status: DC | PRN
Start: 2014-05-29 — End: 2014-05-29
  Administered 2014-05-29 (×3): via INTRAVENOUS

## 2014-05-29 SURGICAL SUPPLY — 24 items
BENZOIN TINCTURE PRP APPL 2/3 (GAUZE/BANDAGES/DRESSINGS) ×2 IMPLANT
BLADE 11 SAFETY STRL DISP (BLADE) ×2 IMPLANT
CHLORAPREP W/TINT 26ML (MISCELLANEOUS) ×2 IMPLANT
CLOTH BEACON ORANGE TIMEOUT ST (SAFETY) ×2 IMPLANT
CONTAINER PREFILL 10% NBF 15ML (MISCELLANEOUS) ×4 IMPLANT
DRSG OPSITE POSTOP 3X4 (GAUZE/BANDAGES/DRESSINGS) ×2 IMPLANT
ELECT REM PT RETURN 9FT ADLT (ELECTROSURGICAL) ×2
ELECTRODE REM PT RTRN 9FT ADLT (ELECTROSURGICAL) ×1 IMPLANT
GLOVE BIO SURGEON STRL SZ 6.5 (GLOVE) ×4 IMPLANT
GOWN STRL REUS W/TWL LRG LVL3 (GOWN DISPOSABLE) ×4 IMPLANT
NS IRRIG 1000ML POUR BTL (IV SOLUTION) ×2 IMPLANT
PACK ABDOMINAL MINOR (CUSTOM PROCEDURE TRAY) ×2 IMPLANT
PENCIL BUTTON HOLSTER BLD 10FT (ELECTRODE) IMPLANT
SPONGE LAP 4X18 X RAY DECT (DISPOSABLE) IMPLANT
STRIP CLOSURE SKIN 1/2X4 (GAUZE/BANDAGES/DRESSINGS) ×2 IMPLANT
SUT PLAIN 0 NONE (SUTURE) ×2 IMPLANT
SUT VIC AB 0 CT1 27 (SUTURE) ×1
SUT VIC AB 0 CT1 27XBRD ANBCTR (SUTURE) ×1 IMPLANT
SUT VIC AB 2-0 SH 27 (SUTURE) ×1
SUT VIC AB 2-0 SH 27XBRD (SUTURE) ×1 IMPLANT
SUT VIC AB 3-0 FS2 27 (SUTURE) ×2 IMPLANT
TOWEL OR 17X24 6PK STRL BLUE (TOWEL DISPOSABLE) ×4 IMPLANT
TRAY FOLEY CATH 14FR (SET/KITS/TRAYS/PACK) ×2 IMPLANT
WATER STERILE IRR 1000ML POUR (IV SOLUTION) ×2 IMPLANT

## 2014-05-29 NOTE — Anesthesia Preprocedure Evaluation (Signed)
Anesthesia Evaluation  Patient identified by MRN, date of birth, ID band Patient awake    Reviewed: Allergy & Precautions, H&P , Patient's Chart, lab work & pertinent test results  Airway Mallampati: II TM Distance: >3 FB Neck ROM: full    Dental  (+) Teeth Intact   Pulmonary former smoker,  breath sounds clear to auscultation        Cardiovascular Rhythm:regular Rate:Normal     Neuro/Psych    GI/Hepatic   Endo/Other  diabetes, Gestational  Renal/GU      Musculoskeletal   Abdominal   Peds  Hematology  (+) anemia ,   Anesthesia Other Findings       Reproductive/Obstetrics (+) Pregnancy                           Anesthesia Physical Anesthesia Plan  ASA: II  Anesthesia Plan: Epidural   Post-op Pain Management:    Induction:   Airway Management Planned:   Additional Equipment:   Intra-op Plan:   Post-operative Plan:   Informed Consent: I have reviewed the patients History and Physical, chart, labs and discussed the procedure including the risks, benefits and alternatives for the proposed anesthesia with the patient or authorized representative who has indicated his/her understanding and acceptance.   Dental Advisory Given  Plan Discussed with:   Anesthesia Plan Comments: (Labs checked- platelets confirmed with RN in room. Fetal heart tracing, per RN, reported to be stable enough for sitting procedure. Discussed epidural, and patient consents to the procedure:  included risk of possible headache,backache, failed block, allergic reaction, and nerve injury. This patient was asked if she had any questions or concerns before the procedure started.)        Anesthesia Quick Evaluation

## 2014-05-29 NOTE — Lactation Note (Signed)
This note was copied from the chart of Makayla Arvin CollardBetsy Torok. Lactation Consultation Note  Patient Name: Makayla Lopez WRUEA'VToday's Date: 05/29/2014 Reason for consult: Initial assessment Baby 11 hours of life. Mom reports baby nursing well but prefers left breast. Baby nursing on right breast now. Suggest mom try football hold on right breast. Baby latches well/deeply, suckles rhythmically with intermittent swallows noted. Mom states she is able to hand express colostrum. Enc mom to nurse with cues, and at least 8-12 times per 24 hours. Enc mom to offer lots of STS. Given LC brochure, aware of OP/BFSG and community resources.  Maternal Data Has patient been taught Hand Expression?: Yes Does the patient have breastfeeding experience prior to this delivery?: Yes  Feeding Feeding Type: Breast Fed ([facility] Length of feed:  (LC assess 15 minutes of BF)  LATCH Score/Interventions Latch: Grasps breast easily, tongue down, lips flanged, rhythmical sucking. (Mom switched to football hold on right breast.)  Audible Swallowing: A few with stimulation  Type of Nipple: Everted at rest and after stimulation  Comfort (Breast/Nipple): Soft / non-tender     Hold (Positioning): No assistance needed to correctly position infant at breast.  LATCH Score: 9  Lactation Tools Discussed/Used     Consult Status Consult Status: Follow-up Date: 05/30/14 Follow-up type: In-patient    Geralynn OchsWILLIARD, Violia Knopf 05/29/2014, 10:56 PM

## 2014-05-29 NOTE — Anesthesia Preprocedure Evaluation (Signed)
Anesthesia Evaluation  Patient identified by MRN, date of birth, ID band Patient awake    Reviewed: Allergy & Precautions, H&P , Patient's Chart, lab work & pertinent test results  Airway Mallampati: II TM Distance: >3 FB Neck ROM: full    Dental  (+) Teeth Intact   Pulmonary former smoker,  breath sounds clear to auscultation        Cardiovascular Rhythm:regular Rate:Normal     Neuro/Psych    GI/Hepatic   Endo/Other  diabetes, Gestational  Renal/GU      Musculoskeletal   Abdominal   Peds  Hematology  (+) anemia ,   Anesthesia Other Findings       Reproductive/Obstetrics (+) Pregnancy                           Anesthesia Physical  Anesthesia Plan  ASA: II  Anesthesia Plan: Epidural   Post-op Pain Management:    Induction:   Airway Management Planned:   Additional Equipment:   Intra-op Plan:   Post-operative Plan:   Informed Consent: I have reviewed the patients History and Physical, chart, labs and discussed the procedure including the risks, benefits and alternatives for the proposed anesthesia with the patient or authorized representative who has indicated his/her understanding and acceptance.   Dental Advisory Given  Plan Discussed with:   Anesthesia Plan Comments: (Labs checked- platelets confirmed with RN in room. Fetal heart tracing, per RN, reported to be stable enough for sitting procedure. Discussed epidural, and patient consents to the procedure:  included risk of possible headache,backache, failed block, allergic reaction, and nerve injury. This patient was asked if she had any questions or concerns before the procedure started.  For PPTL with epidural intact.)        Anesthesia Quick Evaluation

## 2014-05-29 NOTE — Anesthesia Procedure Notes (Signed)
Epidural Patient location during procedure: OB  Preanesthetic Checklist Completed: patient identified, site marked, surgical consent, pre-op evaluation, timeout performed, IV checked, risks and benefits discussed and monitors and equipment checked  Epidural Patient position: sitting Prep: site prepped and draped and DuraPrep Patient monitoring: continuous pulse ox and blood pressure Approach: midline Injection technique: LOR air  Needle:  Needle type: Tuohy  Needle gauge: 17 G Needle length: 9 cm and 9 Needle insertion depth: 4 cm Catheter type: closed end flexible Catheter size: 19 Gauge Catheter at skin depth: 10 cm Test dose: negative  Assessment Events: blood not aspirated, injection not painful, no injection resistance, negative IV test and no paresthesia  Additional Notes Dosing of Epidural:  1st dose, through catheter .............................................  Xylocaine 40 mg  2nd dose, through catheter, after waiting 3 minutes.........Xylocaine 60 mg    ( 1% Xylo charted as a single dose in Epic Meds for ease of charting; actual dosing was fractionated as above, for saftey's sake)  As each dose occurred, patient was free of IV sx; and patient exhibited no evidence of SA injection.  Patient is more comfortable after epidural dosed. Please see RN's note for documentation of vital signs,and FHR which are stable.  Patient reminded not to try to ambulate with numb legs, and that an RN must be present when she attempts to get up.       

## 2014-05-29 NOTE — Transfer of Care (Signed)
Immediate Anesthesia Transfer of Care Note  Patient: Makayla Lopez  Procedure(s) Performed: Procedure(s) with comments: POST PARTUM TUBAL LIGATION- bilateral salpingectomy (Bilateral) - salpingectomy  Patient Location: PACU  Anesthesia Type:Epidural  Level of Consciousness: awake, alert , oriented and patient cooperative  Airway & Oxygen Therapy: Patient Spontanous Breathing  Post-op Assessment: Report given to PACU RN and Post -op Vital signs reviewed and stable  Post vital signs: Reviewed and stable  Complications: No apparent anesthesia complications

## 2014-05-29 NOTE — Anesthesia Postprocedure Evaluation (Signed)
  Anesthesia Post-op Note  Patient: Nature conservation officerBetsy Burgoon  Procedure(s) Performed: * No procedures listed *  Patient Location: Mother/Baby  Anesthesia Type:Epidural  Level of Consciousness: awake, alert , oriented and patient cooperative  Airway and Oxygen Therapy: Patient Spontanous Breathing  Post-op Pain: mild  Post-op Assessment: Post-op Vital signs reviewed, Patient's Cardiovascular Status Stable, Respiratory Function Stable, Patent Airway, No signs of Nausea or vomiting, Adequate PO intake, Pain level controlled and No headache  Post-op Vital Signs: Reviewed and stable  Last Vitals:  Filed Vitals:   05/29/14 1410  BP: 112/73  Pulse: 85  Temp: 36.3 C  Resp: 20    Complications: No apparent anesthesia complications

## 2014-05-29 NOTE — Progress Notes (Signed)
Patient ID: Makayla CollardBetsy Lopez, female   DOB: 09-09-1976, 38 y.o.   MRN: [id number]  No c/o's; comfortable with epidural  AFVSS gen NAD FHTs 120's toco Q 2-344min  AROM for COPIOUS clear fluid without difficulty and complication SVE 4.5/70/-2  vtx by US  Continue pitocin and IOL Expect SVD FSBS in 70's - FSBS q 4

## 2014-05-29 NOTE — Anesthesia Postprocedure Evaluation (Signed)
Anesthesia Post Note  Patient: Makayla Lopez  Procedure(s) Performed: Procedure(s) (LRB): POST PARTUM TUBAL LIGATION- bilateral salpingectomy (Bilateral)  Anesthesia type: Epidural  Patient location: PACU  Post pain: Pain level controlled  Post assessment: Post-op Vital signs reviewed  Last Vitals:  Filed Vitals:   05/29/14 1845  BP:   Pulse: 80  Temp:   Resp:     Post vital signs: Reviewed  Level of consciousness: awake  Complications: No apparent anesthesia complications

## 2014-05-30 ENCOUNTER — Encounter (HOSPITAL_COMMUNITY): Payer: Self-pay | Admitting: Obstetrics and Gynecology

## 2014-05-30 LAB — CBC
HCT: 21.7 % — ABNORMAL LOW (ref 36.0–46.0)
HEMOGLOBIN: 7.1 g/dL — AB (ref 12.0–15.0)
MCH: 29 pg (ref 26.0–34.0)
MCHC: 32.7 g/dL (ref 30.0–36.0)
MCV: 88.6 fL (ref 78.0–100.0)
Platelets: 133 10*3/uL — ABNORMAL LOW (ref 150–400)
RBC: 2.45 MIL/uL — ABNORMAL LOW (ref 3.87–5.11)
RDW: 12.8 % (ref 11.5–15.5)
WBC: 8.5 10*3/uL (ref 4.0–10.5)

## 2014-05-30 NOTE — Brief Op Note (Signed)
05/28/2014 - 05/29/2014  8:46 AM  PATIENT:  Makayla CollardBetsy Lopez  38 y.o. female  PRE-OPERATIVE DIAGNOSIS:  desires sterilization  POST-OPERATIVE DIAGNOSIS:  desires sterilization  PROCEDURE:  Procedure(s) with comments: POST PARTUM TUBAL LIGATION- bilateral salpingectomy (Bilateral) - salpingectomy  SURGEON:  Surgeon(s) and Role:    * Sherian ReinJody Bovard-Stuckert, MD - Primary  ANESTHESIA:   epidural and IV sedation  EBL:   minimal  BLOOD ADMINISTERED:none  DRAINS: Urinary Catheter (Foley) d/c in PACU   LOCAL MEDICATIONS USED:  MARCAINE     SPECIMEN:  Source of Specimen:  R & L tubal segments  DISPOSITION OF SPECIMEN:  PATHOLOGY  COUNTS:  YES  TOURNIQUET:  * No tourniquets in log *  DICTATION: .Other Dictation: Dictation Number [id number]  PLAN OF CARE: Pt is inpatient from delivery  PATIENT DISPOSITION:  PACU - hemodynamically stable.   Delay start of Pharmacological VTE agent (>24hrs) due to surgical blood loss or risk of bleeding: not applicable

## 2014-05-30 NOTE — Lactation Note (Signed)
This note was copied from the chart of Makayla Arvin CollardBetsy Tweten. Lactation Consultation Note Follow up visit at 29 hours of age.  Mom reports baby has been cluster feeding today although asleep in visitors arms now.  Baby has had 8 feedings, 2 voids and 3 stools with latch scores of "9".  Encouraged mom to call for assist as needed and to continue to have MBU Rn's do latch scores.  Mom denies concerns at this time.    Patient Name: Makayla Lopez Makayla Lopez: 05/30/2014 Reason for consult: Follow-up assessment   Maternal Data    Feeding Feeding Type: Breast Fed Length of feed: 30 min  LATCH Score/Interventions Latch: Grasps breast easily, tongue down, lips flanged, rhythmical sucking.  Audible Swallowing: A few with stimulation  Type of Nipple: Everted at rest and after stimulation  Comfort (Breast/Nipple): Soft / non-tender     Hold (Positioning): No assistance needed to correctly position infant at breast. Intervention(s): Breastfeeding basics reviewed  LATCH Score: 9  Lactation Tools Discussed/Used     Consult Status Consult Status: Follow-up Lopez: 05/31/14 Follow-up type: In-patient    Makayla Lopez, Makayla Lopez 05/30/2014, 5:07 PM

## 2014-05-30 NOTE — Anesthesia Postprocedure Evaluation (Signed)
  Anesthesia Post-op Note  Patient: Nature conservation officerBetsy Lopez  Procedure(s) Performed: * No procedures listed *  Patient Location: PACU and Mother/Baby  Anesthesia Type:Epidural  Level of Consciousness: awake, alert  and oriented  Airway and Oxygen Therapy: Patient Spontanous Breathing  Post-op Pain: mild  Post-op Assessment: Patient's Cardiovascular Status Stable, Respiratory Function Stable, No signs of Nausea or vomiting, Adequate PO intake, Pain level controlled, No headache, No backache, No residual numbness and No residual motor weakness  Post-op Vital Signs: Reviewed and stable  Last Vitals:  Filed Vitals:   05/30/14 0525  BP: 104/61  Pulse: 75  Temp: 36.5 C  Resp: 18    Complications: No apparent anesthesia complications

## 2014-05-30 NOTE — Progress Notes (Signed)
PPD #1 No problems Afeb, VSS Fundus firm, NT at U-1, dressing C/D/I Continue routine postpartum care

## 2014-05-30 NOTE — Op Note (Signed)
NAMArvin Collard:  Quist, Jessicia                 ACCOUNT NO.:  [id number]  MEDICAL RECORD NO.:  [id number]  LOCATION:  9139                          FACILITY:  [facility]  PHYSICIAN:  Sherron MondayJody Bovard, MD        DATE OF BIRTH:  [birth year]  DATE OF PROCEDURE:  05/30/2014 DATE OF DISCHARGE:                              OPERATIVE REPORT   PREOPERATIVE DIAGNOSIS:  Status post spontaneous vaginal delivery, desires sterilization.  POSTOPERATIVE DIAGNOSIS:  Status post spontaneous vaginal delivery, desires sterilization.  PROCEDURE:  Postpartum tubal ligation by bilateral salpingectomy.  SURGEON:  Sherron MondayJody Bovard, MD  ANESTHESIA:  Epidural and IV sedation.  EBL:  Minimal.  COMPLICATIONS:  None.  PATHOLOGY:  Right and left tubal segments.  DESCRIPTION OF PROCEDURE:  After informed consent was reviewed with the patient, including risks, benefits, and alternatives of surgical procedure and her epidural was placed adequately.  An approximately 2 cm infraumbilical incision was made.  The fascia was then incised with scissors.  The incision was extended superiorly and inferiorly.  The peritoneum was entered bluntly.  The postpartum uterus was at the level of the umbilicus.  The tubes were easily identified, first on the left followed out to the fimbriated end.  This fimbriated end and distal 2/3rd of the tube were held with a Tresa EndoKelly and then doubly suture ligated with 0 plain gut, noted to be hemostatic.  The same procedure was performed on the right, also noted to be hemostatic and doubly ligated with 0 Vicryl, and again this was inspected, found to be hemostatic. The fascia was then reapproximated with 0 Vicryl, the deep suture of 2-0 Vicryl was used, and the skin was closed with 4-0 Vicryl in subcuticular fashion.  Benzoin and Steri's were applied.  Patient tolerated the procedure well.  Sponge, lap, and needle counts were correct x2 per the operating staff.     Sherron MondayJody Bovard, MD     JB/MEDQ  D:   05/30/2014  T:  05/30/2014  Job:  [id number]

## 2014-05-31 MED ORDER — OXYCODONE-ACETAMINOPHEN 5-325 MG PO TABS: 1.0000 | ORAL_TABLET | ORAL | Status: AC | PRN

## 2014-05-31 MED ORDER — IBUPROFEN 600 MG PO TABS: 600.0000 mg | ORAL_TABLET | Freq: Four times a day (QID) | ORAL | Status: AC

## 2014-05-31 NOTE — Discharge Instructions (Signed)
As per discharge pamphlet °

## 2014-05-31 NOTE — Discharge Summary (Signed)
Obstetric Discharge Summary Reason for Admission: induction of labor Prenatal Procedures: none Intrapartum Procedures: spontaneous vaginal delivery Postpartum Procedures: P.P. tubal ligation Complications-Operative and Postpartum: 2nd degree perineal laceration Hemoglobin  Date Value Ref Range Status  05/30/2014 7.1* 12.0 - 15.0 g/dL Final     REPEATED TO VERIFY     DELTA CHECK NOTED     HCT  Date Value Ref Range Status  05/30/2014 21.7* 36.0 - 46.0 % Final    Physical Exam:  General: alert Lochia: appropriate Uterine Fundus: firm Incision: healing well   Discharge Diagnoses: Term Pregnancy-delivered and GDM  Discharge Information: Date: 05/31/2014 Activity: pelvic rest Diet: routine Medications: Ibuprofen and Percocet Condition: stable Instructions: refer to practice specific booklet Discharge to: home Follow-up Information   Follow up with Bovard-Stuckert, Jody, MD. Schedule an appointment as soon as possible for a visit in 6 weeks.   Specialty:  Obstetrics and Gynecology   Contact information:   [address] [place] [address] [phone]       Newborn Data: Live born female  Birth Weight: 8 lb 9.7 oz (3905 g) APGAR: 9, 9  Home with mother.  Masayuki Sakai D 05/31/2014, 9:35 AM

## 2014-05-31 NOTE — Progress Notes (Signed)
PPD #2 Doing ok, sore Afeb, VSS Abd- soft, incision intact D/c home

## 2014-09-29 ENCOUNTER — Encounter (HOSPITAL_COMMUNITY): Payer: Self-pay | Admitting: Obstetrics and Gynecology
